# Patient Record
Sex: Female | Born: 1945 | Race: White | Hispanic: No | Smoking: Never smoker
Health system: Southern US, Community
[De-identification: ages and names within clinical notes are randomized; demographics above are authoritative.]

## PROBLEM LIST (undated history)

## (undated) DIAGNOSIS — R011 Cardiac murmur, unspecified: Secondary | ICD-10-CM

## (undated) DIAGNOSIS — D689 Coagulation defect, unspecified: Secondary | ICD-10-CM

## (undated) DIAGNOSIS — T7840XA Allergy, unspecified, initial encounter: Secondary | ICD-10-CM

## (undated) DIAGNOSIS — D6851 Activated protein C resistance: Secondary | ICD-10-CM

## (undated) DIAGNOSIS — H269 Unspecified cataract: Secondary | ICD-10-CM

## (undated) DIAGNOSIS — N200 Calculus of kidney: Secondary | ICD-10-CM

## (undated) DIAGNOSIS — M810 Age-related osteoporosis without current pathological fracture: Secondary | ICD-10-CM

## (undated) DIAGNOSIS — I341 Nonrheumatic mitral (valve) prolapse: Secondary | ICD-10-CM

## (undated) DIAGNOSIS — E785 Hyperlipidemia, unspecified: Secondary | ICD-10-CM

## (undated) DIAGNOSIS — E079 Disorder of thyroid, unspecified: Secondary | ICD-10-CM

## (undated) DIAGNOSIS — F419 Anxiety disorder, unspecified: Secondary | ICD-10-CM

## (undated) DIAGNOSIS — M1990 Unspecified osteoarthritis, unspecified site: Secondary | ICD-10-CM

## (undated) DIAGNOSIS — K219 Gastro-esophageal reflux disease without esophagitis: Secondary | ICD-10-CM

## (undated) HISTORY — DX: Allergy, unspecified, initial encounter: T78.40XA

## (undated) HISTORY — DX: Nonrheumatic mitral (valve) prolapse: I34.1

## (undated) HISTORY — DX: Activated protein C resistance: D68.51

## (undated) HISTORY — DX: Age-related osteoporosis without current pathological fracture: M81.0

## (undated) HISTORY — DX: Gastro-esophageal reflux disease without esophagitis: K21.9

## (undated) HISTORY — DX: Disorder of thyroid, unspecified: E07.9

## (undated) HISTORY — PX: CYSTOCELE REPAIR: SHX163

## (undated) HISTORY — DX: Hyperlipidemia, unspecified: E78.5

## (undated) HISTORY — DX: Calculus of kidney: N20.0

## (undated) HISTORY — DX: Anxiety disorder, unspecified: F41.9

## (undated) HISTORY — PX: COSMETIC SURGERY: SHX468

## (undated) HISTORY — DX: Coagulation defect, unspecified: D68.9

## (undated) HISTORY — DX: Cardiac murmur, unspecified: R01.1

## (undated) HISTORY — PX: BREAST BIOPSY: SHX20

## (undated) HISTORY — DX: Unspecified osteoarthritis, unspecified site: M19.90

## (undated) HISTORY — DX: Unspecified cataract: H26.9

---

## 2016-05-16 HISTORY — PX: COSMETIC SURGERY: SHX468

## 2016-07-15 DIAGNOSIS — M8580 Other specified disorders of bone density and structure, unspecified site: Secondary | ICD-10-CM | POA: Diagnosis not present

## 2016-07-15 DIAGNOSIS — Z1231 Encounter for screening mammogram for malignant neoplasm of breast: Secondary | ICD-10-CM | POA: Diagnosis not present

## 2016-07-15 DIAGNOSIS — Z780 Asymptomatic menopausal state: Secondary | ICD-10-CM | POA: Diagnosis not present

## 2016-08-18 DIAGNOSIS — E039 Hypothyroidism, unspecified: Secondary | ICD-10-CM | POA: Diagnosis not present

## 2016-08-18 DIAGNOSIS — E559 Vitamin D deficiency, unspecified: Secondary | ICD-10-CM | POA: Diagnosis not present

## 2016-08-18 DIAGNOSIS — E785 Hyperlipidemia, unspecified: Secondary | ICD-10-CM | POA: Diagnosis not present

## 2016-08-18 DIAGNOSIS — Z1159 Encounter for screening for other viral diseases: Secondary | ICD-10-CM | POA: Diagnosis not present

## 2016-09-01 DIAGNOSIS — E039 Hypothyroidism, unspecified: Secondary | ICD-10-CM | POA: Diagnosis not present

## 2016-09-01 DIAGNOSIS — L282 Other prurigo: Secondary | ICD-10-CM | POA: Diagnosis not present

## 2016-09-08 DIAGNOSIS — H353131 Nonexudative age-related macular degeneration, bilateral, early dry stage: Secondary | ICD-10-CM | POA: Diagnosis not present

## 2016-09-08 DIAGNOSIS — H02831 Dermatochalasis of right upper eyelid: Secondary | ICD-10-CM | POA: Diagnosis not present

## 2016-09-08 DIAGNOSIS — H02834 Dermatochalasis of left upper eyelid: Secondary | ICD-10-CM | POA: Diagnosis not present

## 2016-10-17 DIAGNOSIS — E039 Hypothyroidism, unspecified: Secondary | ICD-10-CM | POA: Diagnosis not present

## 2016-12-28 DIAGNOSIS — Z86010 Personal history of colonic polyps: Secondary | ICD-10-CM | POA: Diagnosis not present

## 2016-12-28 DIAGNOSIS — K2270 Barrett's esophagus without dysplasia: Secondary | ICD-10-CM | POA: Diagnosis not present

## 2017-01-04 DIAGNOSIS — H02403 Unspecified ptosis of bilateral eyelids: Secondary | ICD-10-CM | POA: Diagnosis not present

## 2017-01-04 DIAGNOSIS — H02834 Dermatochalasis of left upper eyelid: Secondary | ICD-10-CM | POA: Diagnosis not present

## 2017-01-04 DIAGNOSIS — H02831 Dermatochalasis of right upper eyelid: Secondary | ICD-10-CM | POA: Diagnosis not present

## 2017-01-18 DIAGNOSIS — Z86010 Personal history of colonic polyps: Secondary | ICD-10-CM | POA: Diagnosis not present

## 2017-01-31 DIAGNOSIS — H02834 Dermatochalasis of left upper eyelid: Secondary | ICD-10-CM | POA: Diagnosis not present

## 2017-01-31 DIAGNOSIS — H02831 Dermatochalasis of right upper eyelid: Secondary | ICD-10-CM | POA: Diagnosis not present

## 2017-01-31 DIAGNOSIS — H02403 Unspecified ptosis of bilateral eyelids: Secondary | ICD-10-CM | POA: Diagnosis not present

## 2017-02-06 DIAGNOSIS — Z01812 Encounter for preprocedural laboratory examination: Secondary | ICD-10-CM | POA: Diagnosis not present

## 2017-02-07 DIAGNOSIS — H02402 Unspecified ptosis of left eyelid: Secondary | ICD-10-CM | POA: Diagnosis not present

## 2017-02-07 DIAGNOSIS — H02401 Unspecified ptosis of right eyelid: Secondary | ICD-10-CM | POA: Diagnosis not present

## 2017-02-07 DIAGNOSIS — H02834 Dermatochalasis of left upper eyelid: Secondary | ICD-10-CM | POA: Diagnosis not present

## 2017-02-07 DIAGNOSIS — H02831 Dermatochalasis of right upper eyelid: Secondary | ICD-10-CM | POA: Diagnosis not present

## 2017-02-07 DIAGNOSIS — H02403 Unspecified ptosis of bilateral eyelids: Secondary | ICD-10-CM | POA: Diagnosis not present

## 2017-04-11 DIAGNOSIS — N812 Incomplete uterovaginal prolapse: Secondary | ICD-10-CM | POA: Diagnosis not present

## 2017-04-11 DIAGNOSIS — Z124 Encounter for screening for malignant neoplasm of cervix: Secondary | ICD-10-CM | POA: Diagnosis not present

## 2017-05-03 DIAGNOSIS — Z23 Encounter for immunization: Secondary | ICD-10-CM | POA: Diagnosis not present

## 2017-06-27 DIAGNOSIS — H57813 Brow ptosis, bilateral: Secondary | ICD-10-CM | POA: Diagnosis not present

## 2017-07-06 DIAGNOSIS — Z6825 Body mass index (BMI) 25.0-25.9, adult: Secondary | ICD-10-CM | POA: Diagnosis not present

## 2017-07-06 DIAGNOSIS — F329 Major depressive disorder, single episode, unspecified: Secondary | ICD-10-CM | POA: Diagnosis not present

## 2017-07-06 DIAGNOSIS — K219 Gastro-esophageal reflux disease without esophagitis: Secondary | ICD-10-CM | POA: Diagnosis not present

## 2017-07-06 DIAGNOSIS — Z1331 Encounter for screening for depression: Secondary | ICD-10-CM | POA: Diagnosis not present

## 2017-07-06 DIAGNOSIS — E039 Hypothyroidism, unspecified: Secondary | ICD-10-CM | POA: Diagnosis not present

## 2017-07-06 DIAGNOSIS — Z1231 Encounter for screening mammogram for malignant neoplasm of breast: Secondary | ICD-10-CM | POA: Diagnosis not present

## 2017-07-06 DIAGNOSIS — N8110 Cystocele, unspecified: Secondary | ICD-10-CM | POA: Diagnosis not present

## 2017-07-06 DIAGNOSIS — Z136 Encounter for screening for cardiovascular disorders: Secondary | ICD-10-CM | POA: Diagnosis not present

## 2017-07-18 DIAGNOSIS — N812 Incomplete uterovaginal prolapse: Secondary | ICD-10-CM | POA: Diagnosis not present

## 2017-07-25 DIAGNOSIS — D251 Intramural leiomyoma of uterus: Secondary | ICD-10-CM | POA: Diagnosis not present

## 2017-07-25 DIAGNOSIS — N812 Incomplete uterovaginal prolapse: Secondary | ICD-10-CM | POA: Diagnosis not present

## 2017-07-25 DIAGNOSIS — N8111 Cystocele, midline: Secondary | ICD-10-CM | POA: Diagnosis not present

## 2017-07-25 DIAGNOSIS — N880 Leukoplakia of cervix uteri: Secondary | ICD-10-CM | POA: Diagnosis not present

## 2017-07-25 DIAGNOSIS — D259 Leiomyoma of uterus, unspecified: Secondary | ICD-10-CM | POA: Diagnosis not present

## 2017-07-25 DIAGNOSIS — N814 Uterovaginal prolapse, unspecified: Secondary | ICD-10-CM | POA: Diagnosis not present

## 2017-07-25 DIAGNOSIS — N858 Other specified noninflammatory disorders of uterus: Secondary | ICD-10-CM | POA: Diagnosis not present

## 2017-07-25 HISTORY — PX: ABDOMINAL HYSTERECTOMY: SHX81

## 2017-07-26 DIAGNOSIS — D251 Intramural leiomyoma of uterus: Secondary | ICD-10-CM | POA: Diagnosis not present

## 2017-07-26 DIAGNOSIS — N880 Leukoplakia of cervix uteri: Secondary | ICD-10-CM | POA: Diagnosis not present

## 2017-07-26 DIAGNOSIS — N812 Incomplete uterovaginal prolapse: Secondary | ICD-10-CM | POA: Diagnosis not present

## 2017-07-27 DIAGNOSIS — K625 Hemorrhage of anus and rectum: Secondary | ICD-10-CM | POA: Diagnosis not present

## 2017-07-27 DIAGNOSIS — K529 Noninfective gastroenteritis and colitis, unspecified: Secondary | ICD-10-CM | POA: Diagnosis not present

## 2017-07-27 DIAGNOSIS — Z90710 Acquired absence of both cervix and uterus: Secondary | ICD-10-CM | POA: Diagnosis not present

## 2017-08-02 DIAGNOSIS — K625 Hemorrhage of anus and rectum: Secondary | ICD-10-CM | POA: Diagnosis not present

## 2017-08-02 DIAGNOSIS — K2270 Barrett's esophagus without dysplasia: Secondary | ICD-10-CM | POA: Diagnosis not present

## 2017-08-02 DIAGNOSIS — R1030 Lower abdominal pain, unspecified: Secondary | ICD-10-CM | POA: Diagnosis not present

## 2017-08-03 DIAGNOSIS — R8279 Other abnormal findings on microbiological examination of urine: Secondary | ICD-10-CM | POA: Diagnosis not present

## 2017-08-03 DIAGNOSIS — E039 Hypothyroidism, unspecified: Secondary | ICD-10-CM | POA: Diagnosis not present

## 2017-08-03 DIAGNOSIS — F329 Major depressive disorder, single episode, unspecified: Secondary | ICD-10-CM | POA: Diagnosis not present

## 2017-08-03 DIAGNOSIS — Z6825 Body mass index (BMI) 25.0-25.9, adult: Secondary | ICD-10-CM | POA: Diagnosis not present

## 2017-08-03 DIAGNOSIS — N390 Urinary tract infection, site not specified: Secondary | ICD-10-CM | POA: Diagnosis not present

## 2017-08-03 DIAGNOSIS — K559 Vascular disorder of intestine, unspecified: Secondary | ICD-10-CM | POA: Diagnosis not present

## 2017-08-15 DIAGNOSIS — H57813 Brow ptosis, bilateral: Secondary | ICD-10-CM | POA: Diagnosis not present

## 2017-09-07 DIAGNOSIS — H25813 Combined forms of age-related cataract, bilateral: Secondary | ICD-10-CM | POA: Diagnosis not present

## 2017-09-07 DIAGNOSIS — H353132 Nonexudative age-related macular degeneration, bilateral, intermediate dry stage: Secondary | ICD-10-CM | POA: Diagnosis not present

## 2017-09-15 DIAGNOSIS — E039 Hypothyroidism, unspecified: Secondary | ICD-10-CM | POA: Diagnosis not present

## 2017-09-15 DIAGNOSIS — Z136 Encounter for screening for cardiovascular disorders: Secondary | ICD-10-CM | POA: Diagnosis not present

## 2017-09-21 DIAGNOSIS — Z1231 Encounter for screening mammogram for malignant neoplasm of breast: Secondary | ICD-10-CM | POA: Diagnosis not present

## 2018-01-17 ENCOUNTER — Encounter: Payer: Self-pay | Admitting: Family Medicine

## 2018-01-17 ENCOUNTER — Ambulatory Visit (INDEPENDENT_AMBULATORY_CARE_PROVIDER_SITE_OTHER): Payer: Medicare Other | Admitting: Family Medicine

## 2018-01-17 VITALS — BP 122/69 | HR 49 | Temp 97.4°F | Ht 68.0 in | Wt 151.6 lb

## 2018-01-17 DIAGNOSIS — E782 Mixed hyperlipidemia: Secondary | ICD-10-CM

## 2018-01-17 DIAGNOSIS — F411 Generalized anxiety disorder: Secondary | ICD-10-CM | POA: Diagnosis not present

## 2018-01-17 DIAGNOSIS — K219 Gastro-esophageal reflux disease without esophagitis: Secondary | ICD-10-CM | POA: Insufficient documentation

## 2018-01-17 DIAGNOSIS — E039 Hypothyroidism, unspecified: Secondary | ICD-10-CM | POA: Diagnosis not present

## 2018-01-17 DIAGNOSIS — J309 Allergic rhinitis, unspecified: Secondary | ICD-10-CM | POA: Diagnosis not present

## 2018-01-17 DIAGNOSIS — F339 Major depressive disorder, recurrent, unspecified: Secondary | ICD-10-CM | POA: Diagnosis not present

## 2018-01-17 DIAGNOSIS — R32 Unspecified urinary incontinence: Secondary | ICD-10-CM | POA: Diagnosis not present

## 2018-01-17 DIAGNOSIS — E785 Hyperlipidemia, unspecified: Secondary | ICD-10-CM | POA: Insufficient documentation

## 2018-01-17 MED ORDER — ATORVASTATIN CALCIUM 40 MG PO TABS: 40.0000 mg | ORAL_TABLET | Freq: Every day | ORAL | 1 refills | Status: DC

## 2018-01-17 MED ORDER — MONTELUKAST SODIUM 10 MG PO TABS: 10.0000 mg | ORAL_TABLET | Freq: Every day | ORAL | 1 refills | Status: DC

## 2018-01-17 MED ORDER — LEVOTHYROXINE SODIUM 88 MCG PO TABS: 88.0000 ug | ORAL_TABLET | Freq: Every day | ORAL | 0 refills | Status: DC

## 2018-01-17 MED ORDER — FLUOXETINE HCL 20 MG PO CAPS: 20.0000 mg | ORAL_CAPSULE | Freq: Every day | ORAL | 1 refills | Status: DC

## 2018-01-17 MED ORDER — MIRABEGRON ER 25 MG PO TB24: 25.0000 mg | ORAL_TABLET | Freq: Every day | ORAL | 1 refills | Status: DC

## 2018-01-17 NOTE — Progress Notes (Signed)
BP 122/69   Pulse (!) 49   Temp (!) 97.4 F (36.3 C) (Oral)   Ht 5' 8"  (1.727 m)   Wt 151 lb 9.6 oz (68.8 kg)   BMI 23.05 kg/m    Subjective:    Patient ID: Teresa Frederick, female    DOB: April 29, 1946, 72 y.o.   MRN: [id number]  HPI: Teresa Frederick is a 72 y.o. female presenting on 01/17/2018 for New Patient (Initial Visit) (Pateint moved from [place]. Patient states she had a hysterectomy 07/25/17. Prior to that had bladder leakage. Waited until 5/31 to have inntercourse and states that she started bleeding and now is having bladder leaking again.) and Establish Care   HPI Hypothyroidism recheck Patient is coming in for thyroid recheck today as well. They deny any issues with hair changes or heat or cold problems or diarrhea or constipation. They deny any chest pain or palpitations. They are currently on levothyroxine 39mcrograms   Hyperlipidemia Patient is coming in for recheck of his hyperlipidemia. The patient is currently taking atorvastatin 40. They deny any issues with myalgias or history of liver damage from it. They deny any focal numbness or weakness or chest pain.   GERD Patient is currently on Nexium.  She denies any major symptoms or abdominal pain or belching or burping. She denies any blood in her stool or lightheadedness or dizziness.   Anxiety and depression Patient has been on treatment for anxiety depression as well from her previous doctor [place]  She has been on Prozac and says that it works well for her and has been very happy with it for quite a few years.  She denies any suicidal ideations or thoughts of hurting herself. Depression screen PHQ 2/9 01/17/2018  Decreased Interest 0  Down, Depressed, Hopeless 0  PHQ - 2 Score 0    Chronic allergic rhinitis Takes medication for chronic nasal and sinus allergy congestion.  She has been on montelukast and it has worked well for her.  She wishes to continue it.  Urinary  incontinence Patient has some bladder leakage at times and says that she used to be on treatment for it but has not been in some time because of expense.  She says that she has trouble with urgency and trouble making it to the restroom on time.  She denies any fevers or chills or pain with urination.  She denies any blood in urine.  Relevant past medical, surgical, family and social history reviewed and updated as indicated. Interim medical history since our last visit reviewed. Allergies and medications reviewed and updated.  Review of Systems  Constitutional: Negative for chills and fever.  HENT: Positive for congestion. Negative for ear discharge and ear pain.   Eyes: Negative for redness and visual disturbance.  Respiratory: Positive for cough. Negative for chest tightness and shortness of breath.   Cardiovascular: Negative for chest pain and leg swelling.  Gastrointestinal: Negative for abdominal pain.  Genitourinary: Positive for frequency and urgency. Negative for difficulty urinating, dysuria, hematuria, vaginal bleeding, vaginal discharge and vaginal pain.  Musculoskeletal: Negative for back pain and gait problem.  Skin: Negative for rash.  Neurological: Negative for light-headedness and headaches.  Psychiatric/Behavioral: Positive for dysphoric mood. Negative for agitation, behavioral problems, self-injury, sleep disturbance and suicidal ideas. The patient is nervous/anxious.   All other systems reviewed and are negative.   Per HPI unless specifically indicated above  Social History   Socioeconomic History  . Marital status:  Widowed    Spouse name: Not on file  . Number of children: Not on file  . Years of education: Not on file  . Highest education level: Not on file  Occupational History  . Not on file  Social Needs  . Financial resource strain: Not on file  . Food insecurity:    Worry: Not on file    Inability: Not on file  . Transportation needs:    Medical: Not  on file    Non-medical: Not on file  Tobacco Use  . Smoking status: Never Smoker  . Smokeless tobacco: Never Used  Substance and Sexual Activity  . Alcohol use: Yes    Alcohol/week: 1.0 standard drinks    Types: 1 Shots of liquor per week    Comment: frequently  . Drug use: Never  . Sexual activity: Yes  Lifestyle  . Physical activity:    Days per week: Not on file    Minutes per session: Not on file  . Stress: Not on file  Relationships  . Social connections:    Talks on phone: Not on file    Gets together: Not on file    Attends religious service: Not on file    Active member of club or organization: Not on file    Attends meetings of clubs or organizations: Not on file    Relationship status: Not on file  . Intimate partner violence:    Fear of current or ex partner: Not on file    Emotionally abused: Not on file    Physically abused: Not on file    Forced sexual activity: Not on file  Other Topics Concern  . Not on file  Social History Narrative   Been with current boyfriend since 2016    Past Surgical History:  Procedure Laterality Date  . ABDOMINAL HYSTERECTOMY  07/25/2017  . COSMETIC SURGERY      Family History  Problem Relation Age of Onset  . COPD Mother   . Diabetes Mother   . Hearing loss Mother   . Hypertension Mother   . Stroke Mother   . Early death Father   . Heart disease Father     Allergies as of 01/17/2018      Reactions   Phenergan [promethazine Hcl] Other (See Comments)   tongue swelling       Medication List        Accurate as of 01/17/18 10:19 AM. Always use your most recent med list.          atorvastatin 40 MG tablet Commonly known as:  LIPITOR Take 1 tablet by mouth daily at 2 PM.   BAYER ASPIRIN 325 MG tablet Generic drug:  aspirin Take 325 mg by mouth daily.   docusate sodium 100 MG capsule Commonly known as:  COLACE Take 100 mg by mouth daily.   esomeprazole 20 MG capsule Commonly known as:  NEXIUM Take 20 mg by  mouth daily at 12 noon.   fenoprofen 600 MG Tabs tablet Commonly known as:  NALFON Take 600 mg by mouth.   FLUoxetine 20 MG capsule Commonly known as:  PROZAC Take 20 mg by mouth daily.   levothyroxine 88 MCG tablet Commonly known as:  SYNTHROID, LEVOTHROID Take 88 mcg by mouth daily before breakfast.   MIRALAX PO Take by mouth.   montelukast 10 MG tablet Commonly known as:  SINGULAIR Take 10 mg by mouth at bedtime.   OVER THE COUNTER MEDICATION Preser Vision  Objective:    BP 122/69   Pulse (!) 49   Temp (!) 97.4 F (36.3 C) (Oral)   Ht 5' 8"  (1.727 m)   Wt 151 lb 9.6 oz (68.8 kg)   BMI 23.05 kg/m   Wt Readings from Last 3 Encounters:  01/17/18 151 lb 9.6 oz (68.8 kg)    Physical Exam  Constitutional: She is oriented to person, place, and time. She appears well-developed and well-nourished. No distress.  HENT:  Mouth/Throat: No oropharyngeal exudate.  Eyes: Pupils are equal, round, and reactive to light. Conjunctivae and EOM are normal.  Neck: Neck supple. No thyromegaly present.  Cardiovascular: Normal rate, regular rhythm, normal heart sounds and intact distal pulses.  No murmur heard. Pulmonary/Chest: Effort normal and breath sounds normal. No respiratory distress. She has no wheezes.  Abdominal: Soft. Bowel sounds are normal. She exhibits no distension and no mass. There is no tenderness. There is no guarding.  Musculoskeletal: Normal range of motion. She exhibits no edema.  Lymphadenopathy:    She has no cervical adenopathy.  Neurological: She is alert and oriented to person, place, and time. Coordination normal.  Skin: Skin is warm and dry. No rash noted. She is not diaphoretic.  Psychiatric: She has a normal mood and affect. Her behavior is normal. Her mood appears not anxious. She does not exhibit a depressed mood. She expresses no suicidal ideation. She expresses no suicidal plans.  Nursing note and vitals reviewed.   No results found for  this or any previous visit.    Assessment & Plan:   Problem List Items Addressed This Visit      Digestive   GERD (gastroesophageal reflux disease)   Relevant Medications   docusate sodium (COLACE) 100 MG capsule   esomeprazole (NEXIUM) 20 MG capsule   Polyethylene Glycol 3350 (MIRALAX PO)   Other Relevant Orders   CBC with Differential/Platelet (Completed)   CMP14+EGFR (Completed)     Endocrine   Hypothyroidism   Relevant Medications   levothyroxine (SYNTHROID, LEVOTHROID) 88 MCG tablet   Other Relevant Orders   TSH (Completed)     Other   Hyperlipemia   Relevant Medications   aspirin (BAYER ASPIRIN) 325 MG tablet   atorvastatin (LIPITOR) 40 MG tablet   Other Relevant Orders   Lipid panel (Completed)   Depression, recurrent (HCC) - Primary   Relevant Medications   FLUoxetine (PROZAC) 20 MG capsule   GAD (generalized anxiety disorder)   Relevant Medications   FLUoxetine (PROZAC) 20 MG capsule    Other Visit Diagnoses    Chronic allergic rhinitis       Relevant Medications   montelukast (SINGULAIR) 10 MG tablet   Urinary incontinence, unspecified type       Relevant Medications   mirabegron ER (MYRBETRIQ) 25 MG TB24 tablet   Other Relevant Orders   Ambulatory referral to Gynecology       Follow up plan: Return in about 6 months (around 07/18/2018), or if symptoms worsen or fail to improve, for hypothyroid, GERD, GAD.  Caryl Pina, MD [facility] 01/17/2018, 10:19 AM

## 2018-01-18 LAB — CBC WITH DIFFERENTIAL/PLATELET
Basophils Absolute: 0 10*3/uL (ref 0.0–0.2)
Basos: 0 %
EOS (ABSOLUTE): 0.1 10*3/uL (ref 0.0–0.4)
Eos: 2 %
Hematocrit: 38.7 % (ref 34.0–46.6)
Hemoglobin: 12.6 g/dL (ref 11.1–15.9)
IMMATURE GRANULOCYTES: 0 %
Immature Grans (Abs): 0 10*3/uL (ref 0.0–0.1)
Lymphocytes Absolute: 1.6 10*3/uL (ref 0.7–3.1)
Lymphs: 30 %
MCH: 30.7 pg (ref 26.6–33.0)
MCHC: 32.6 g/dL (ref 31.5–35.7)
MCV: 94 fL (ref 79–97)
MONOS ABS: 0.6 10*3/uL (ref 0.1–0.9)
Monocytes: 10 %
NEUTROS PCT: 58 %
Neutrophils Absolute: 3.2 10*3/uL (ref 1.4–7.0)
Platelets: 304 10*3/uL (ref 150–450)
RBC: 4.11 x10E6/uL (ref 3.77–5.28)
RDW: 13.7 % (ref 12.3–15.4)
WBC: 5.5 10*3/uL (ref 3.4–10.8)

## 2018-01-18 LAB — LIPID PANEL
CHOL/HDL RATIO: 2.7 ratio (ref 0.0–4.4)
Cholesterol, Total: 179 mg/dL (ref 100–199)
HDL: 67 mg/dL (ref >39)
LDL Calculated: 99 mg/dL (ref 0–99)
Triglycerides: 66 mg/dL (ref 0–149)
VLDL Cholesterol Cal: 13 mg/dL (ref 5–40)

## 2018-01-18 LAB — TSH: TSH: 0.741 u[IU]/mL (ref 0.450–4.500)

## 2018-01-18 LAB — CMP14+EGFR
A/G RATIO: 2.3 — AB (ref 1.2–2.2)
ALK PHOS: 60 IU/L (ref 39–117)
ALT: 18 IU/L (ref 0–32)
AST: 21 IU/L (ref 0–40)
Albumin: 4.5 g/dL (ref 3.5–4.8)
BUN/Creatinine Ratio: 20 (ref 12–28)
BUN: 17 mg/dL (ref 8–27)
Bilirubin Total: 0.6 mg/dL (ref 0.0–1.2)
CALCIUM: 9.4 mg/dL (ref 8.7–10.3)
CO2: 24 mmol/L (ref 20–29)
CREATININE: 0.83 mg/dL (ref 0.57–1.00)
Chloride: 106 mmol/L (ref 96–106)
GFR calc Af Amer: 83 mL/min/{1.73_m2} (ref >59)
GFR, EST NON AFRICAN AMERICAN: 72 mL/min/{1.73_m2} (ref >59)
Globulin, Total: 2 g/dL (ref 1.5–4.5)
Glucose: 89 mg/dL (ref 65–99)
POTASSIUM: 4.1 mmol/L (ref 3.5–5.2)
Sodium: 145 mmol/L — ABNORMAL HIGH (ref 134–144)
Total Protein: 6.5 g/dL (ref 6.0–8.5)

## 2018-02-22 ENCOUNTER — Ambulatory Visit (INDEPENDENT_AMBULATORY_CARE_PROVIDER_SITE_OTHER): Payer: Medicare Other | Admitting: *Deleted

## 2018-02-22 ENCOUNTER — Encounter: Payer: Self-pay | Admitting: *Deleted

## 2018-02-22 VITALS — BP 105/60 | HR 59 | Ht 64.0 in | Wt 153.0 lb

## 2018-02-22 DIAGNOSIS — Z23 Encounter for immunization: Secondary | ICD-10-CM

## 2018-02-22 DIAGNOSIS — Z0000 Encounter for general adult medical examination without abnormal findings: Secondary | ICD-10-CM

## 2018-02-22 DIAGNOSIS — K219 Gastro-esophageal reflux disease without esophagitis: Secondary | ICD-10-CM

## 2018-02-22 NOTE — Progress Notes (Signed)
Subjective:   Teresa Frederick is a 72 y.o. female who presents for a Medicare Annual Wellness Visit. Teresa Frederick lives at home with her boyfriend. She moved from [place] recently after living there for about 10 years. She and her husband lived in [place] prior to that and moved to [place] when he became sick with multiple myeloma and pulmonary fibrosis. Her nephew lives in [place]. She is close with him and his children. She also has a son and a daughter but they live in [place]. She is able to talk with them often and visits as often as possible.    Review of Systems    Patient reports that her overall health is unchanged compared to last year.  Cardiac Risk Factors include: advanced age (>45mn, >>79women);sedentary lifestyle  GI: history of Barrett's esophagus. [facility] [place]. Has increased GERD recently. States that she eats a lot of tomatoes and doesn't take her omeprazole regularly, only as needed.  Urinary: incontinence. Hysterectomy and bladder tack. Worked well at first but has been having problems recently with incontinence both stress and urge and also inability to completely void. Dr Dettinger referred to urogynecologist.   All other systems negative       Current Medications (verified) Outpatient Encounter Medications as of 02/22/2018  Medication Sig  . aspirin (BAYER ASPIRIN) 325 MG tablet Take 325 mg by mouth daily.  .Marland Kitchenatorvastatin (LIPITOR) 40 MG tablet Take 1 tablet (40 mg total) by mouth daily at 2 PM.  . docusate sodium (COLACE) 100 MG capsule Take 100 mg by mouth daily.  .Marland Kitchenesomeprazole (NEXIUM) 20 MG capsule Take 20 mg by mouth daily at 12 noon.  . fenoprofen (NALFON) 600 MG TABS tablet Take 600 mg by mouth.  .Marland KitchenFLUoxetine (PROZAC) 20 MG capsule Take 1 capsule (20 mg total) by mouth daily.  .Marland Kitchenlevothyroxine (SYNTHROID, LEVOTHROID) 88 MCG tablet Take 1  tablet (88 mcg total) by mouth daily before breakfast.  . mirabegron ER (MYRBETRIQ) 25 MG TB24 tablet Take 1 tablet (25 mg total) by mouth daily.  . montelukast (SINGULAIR) 10 MG tablet Take 1 tablet (10 mg total) by mouth at bedtime.  .Marland KitchenOVER THE COUNTER MEDICATION Preser Vision  . Polyethylene Glycol 3350 (MIRALAX PO) Take by mouth.   No facility-administered encounter medications on file as of 02/22/2018.     Allergies (verified) Nsaids and Phenergan [promethazine hcl]   History: Past Medical History:  Diagnosis Date  . Allergy   . Anxiety   . Arthritis   . Cataract   . Clotting disorder ([place]   . GERD (gastroesophageal reflux disease)   . Heart murmur   . Hyperlipidemia   . Mitral valve prolapse   . Osteoporosis   . Thyroid disease    Past Surgical History:  Procedure Laterality Date  . ABDOMINAL HYSTERECTOMY  07/25/2017  . COSMETIC SURGERY     Family History  Problem Relation Age of Onset  . COPD Mother   . Diabetes Mother   . Hearing loss Mother   . Hypertension Mother   . Stroke Mother 826 . Early death Father   . Heart disease Father   . Heart attack Father 526 . Obesity Sister   . Aneurysm Sister   . COPD Brother   . Cancer Grandchild        metastatic cancer from melanoma in her eye   Social History   Socioeconomic  History  . Marital status: Widowed    Spouse name: Not on file  . Number of children: Not on file  . Years of education: Not on file  . Highest education level: Not on file  Occupational History    Comment: real estate investment  Social Needs  . Financial resource strain: Not hard at all  . Food insecurity:    Worry: Never true    Inability: Never true  . Transportation needs:    Medical: No    Non-medical: No  Tobacco Use  . Smoking status: Never Smoker  . Smokeless tobacco: Never Used  Substance and Sexual Activity  . Alcohol use: Yes    Alcohol/week: 1.0 standard drinks    Types: 1 Shots of liquor per week    Comment:  frequently  . Drug use: Never  . Sexual activity: Yes  Lifestyle  . Physical activity:    Days per week: 0 days    Minutes per session: 0 min  . Stress: Not at all  Relationships  . Social connections:    Talks on phone: More than three times a week    Gets together: More than three times a week    Attends religious service: Not on file    Active member of club or organization: Not on file    Attends meetings of clubs or organizations: Not on file    Relationship status: Not on file  Other Topics Concern  . Not on file  Social History Narrative   Been with current boyfriend since 2016    Tobacco Use No.  Clinical Intake:     Pain : No/denies pain     Nutritional Status: BMI 25 -29 Overweight Diabetes: No  How often do you need to have someone help you when you read instructions, pamphlets, or other written materials from your doctor or pharmacy?: 1 - Never What is the last grade level you completed in school?: high school     Information entered by :: Chong Sicilian, RN   Activities of Daily Living In your present state of health, do you have any difficulty performing the following activities: 02/22/2018  Hearing? Y  Comment wears hearing aids  Vision? N  Difficulty concentrating or making decisions? N  Walking or climbing stairs? N  Dressing or bathing? N  Doing errands, shopping? N  Preparing Food and eating ? N  Using the Toilet? N  In the past six months, have you accidently leaked urine? N  Do you have problems with loss of bowel control? N  Managing your Medications? N  Managing your Finances? N  Housekeeping or managing your Housekeeping? N  Some recent data might be hidden     Diet 2 meals a day. Cooks most meals at home Drinks water Has a few scotches a week  Exercise Type of exercise: walking, Time (Minutes): 30, Frequency (Times/Week): 3, Weekly Exercise (Minutes/Week): 90, Intensity: Mild, Exercise limited by: None  identified   Depression Screen PHQ 2/9 Scores 01/17/2018  PHQ - 2 Score 0     Fall Risk Fall Risk  01/17/2018  Falls in the past year? No    Safety Is the patient's home free of loose throw rugs in walkways, pet beds, electrical cords, etc?   yes      Handrails on the stairs?   yes      Adequate lighting?   yes  Patient Care Team: Dettinger, Fransisca Kaufmann, MD as PCP - General (Family Medicine)  Hospitalizations,  surgeries, and ER visits in previous 12 months No hospitalizations, ER visits, or surgeries this past year.   Objective:    Today's Vitals   02/22/18 1114  BP: 105/60  Pulse: (!) 59  Weight: 153 lb (69.4 kg)  Height: _0  (1.626 m)   Body mass index is 26.26 kg/m.  No flowsheet data found.  Hearing/Vision  No hearing or vision deficits noted during visit.  Cognitive Function: MMSE - Mini Mental State Exam 02/22/2018  Orientation to time 5  Orientation to Place 5  Registration 3  Attention/ Calculation 5  Recall 3  Language- name 2 objects 2  Language- repeat 1  Language- follow 3 step command 3  Language- read & follow direction 1  Write a sentence 1  Copy design 1  Total score 30       Normal Cognitive Function Screening: Yes    Immunizations and Health Maintenance Immunization History  Administered Date(s) Administered  . Influenza, High Dose Seasonal PF 02/22/2018   Health Maintenance Due  Topic Date Due  . Hepatitis C Screening  11/27/1945   Health Maintenance  Topic Date Due  . Hepatitis C Screening  [birth year]  . INFLUENZA VACCINE  09/06/2018 (Originally 12/14/2017)  . PNA vac Low Risk Adult (1 of 2 - PCV13) 01/18/2019 (Originally 05/08/2012)  . TETANUS/TDAP  02/23/2019 (Originally 05/08/1966)  . MAMMOGRAM  02/14/2019  . COLONOSCOPY  01/18/2022  . DEXA SCAN  Completed        Assessment:   This is a routine wellness examination for Teresa Frederick.    Plan:    Goals    . Exercise 150 min/wk Moderate Activity         Health  Maintenance Recommendations: Influenza vaccine   Additional Screening Recommendations: Lung: Low Dose CT Chest recommended if Age 77-80 years, 30 pack-year currently smoking OR have quit w/in 15years. Patient does not qualify. Hepatitis C Screening recommended: yes  Today's Orders Orders Placed This Encounter  Procedures  . Flu vaccine HIGH DOSE PF  . Ambulatory referral to Gastroenterology    Referral Priority:   Routine    Referral Type:   Consultation    Referral Reason:   Specialty Services Required    Requested Specialty:   Gastroenterology    Number of Visits Requested:   1  Due for endoscopy to f/u on Barrett's Esophagus  Keep f/u with Dettinger, Fransisca Kaufmann, MD and any other specialty appointments you may have Continue current medications Move carefully to avoid falls. Use assistive devices like a cane or walker if needed. Aim for at least 150 minutes of moderate activity a week. This can be done with chair exercises if necessary. Read or work on puzzles daily Stay connected with friends and family  I have personally reviewed and noted the following in the patient's chart:   . Medical and social history . Use of alcohol, tobacco or illicit drugs  . Current medications and supplements . Functional ability and status . Nutritional status . Physical activity . Advanced directives . List of other physicians . Hospitalizations, surgeries, and ER visits in previous 12 months . Vitals . Screenings to include cognitive, depression, and falls . Referrals and appointments  In addition, I have reviewed and discussed with patient certain preventive protocols, quality metrics, and best practice recommendations. A written personalized care plan for preventive services as well as general preventive health recommendations were provided to patient.     Chong Sicilian, RN   02/22/2018

## 2018-02-22 NOTE — Patient Instructions (Signed)
  Ms. Friedt , Thank you for taking time to come for your Medicare Wellness Visit. I appreciate your ongoing commitment to your health goals. Please review the following plan we discussed and let me know if I can assist you in the future.   These are the goals we discussed: Goals    . Exercise 150 min/wk Moderate Activity       This is a list of the screening recommended for you and due dates:  Health Maintenance  Topic Date Due  .  Hepatitis C: One time screening is recommended by Center for Disease Control  (CDC) for  adults born from [birth year] through 1964.   [birth year]  . Tetanus Vaccine  05/08/1966  . Flu Shot  09/06/2018*  . Pneumonia vaccines (1 of 2 - PCV13) 01/18/2019*  . Mammogram  02/14/2019  . Colon Cancer Screening  01/18/2022  . DEXA scan (bone density measurement)  Completed  *Topic was postponed. The date shown is not the original due date.

## 2018-03-05 DIAGNOSIS — K219 Gastro-esophageal reflux disease without esophagitis: Secondary | ICD-10-CM | POA: Diagnosis not present

## 2018-03-05 DIAGNOSIS — K2270 Barrett's esophagus without dysplasia: Secondary | ICD-10-CM | POA: Diagnosis not present

## 2018-03-07 DIAGNOSIS — N398 Other specified disorders of urinary system: Secondary | ICD-10-CM | POA: Diagnosis not present

## 2018-03-07 DIAGNOSIS — N3946 Mixed incontinence: Secondary | ICD-10-CM | POA: Diagnosis not present

## 2018-03-07 DIAGNOSIS — N952 Postmenopausal atrophic vaginitis: Secondary | ICD-10-CM | POA: Diagnosis not present

## 2018-03-07 DIAGNOSIS — K599 Functional intestinal disorder, unspecified: Secondary | ICD-10-CM | POA: Diagnosis not present

## 2018-03-30 DIAGNOSIS — K228 Other specified diseases of esophagus: Secondary | ICD-10-CM | POA: Diagnosis not present

## 2018-03-30 DIAGNOSIS — K2270 Barrett's esophagus without dysplasia: Secondary | ICD-10-CM | POA: Diagnosis not present

## 2018-03-30 DIAGNOSIS — K317 Polyp of stomach and duodenum: Secondary | ICD-10-CM | POA: Diagnosis not present

## 2018-04-06 ENCOUNTER — Encounter: Payer: Self-pay | Admitting: Family Medicine

## 2018-04-06 ENCOUNTER — Ambulatory Visit (INDEPENDENT_AMBULATORY_CARE_PROVIDER_SITE_OTHER): Payer: Medicare Other | Admitting: Family Medicine

## 2018-04-06 VITALS — BP 128/87 | HR 74 | Temp 96.6°F | Ht 64.0 in | Wt 153.0 lb

## 2018-04-06 DIAGNOSIS — R300 Dysuria: Secondary | ICD-10-CM | POA: Diagnosis not present

## 2018-04-06 DIAGNOSIS — R350 Frequency of micturition: Secondary | ICD-10-CM | POA: Diagnosis not present

## 2018-04-06 LAB — MICROSCOPIC EXAMINATION
RENAL EPITHEL UA: NONE SEEN /HPF
WBC, UA: >30 /hpf — AB (ref 0–5)

## 2018-04-06 LAB — URINALYSIS, COMPLETE
Bilirubin, UA: NEGATIVE
Glucose, UA: NEGATIVE
Ketones, UA: NEGATIVE
Nitrite, UA: NEGATIVE
PH UA: 6 (ref 5.0–7.5)
Protein, UA: NEGATIVE
RBC, UA: NEGATIVE
SPEC GRAV UA: 1.02 (ref 1.005–1.030)
Urobilinogen, Ur: 0.2 mg/dL (ref 0.2–1.0)

## 2018-04-06 NOTE — Progress Notes (Signed)
Subjective:    Patient ID: Teresa Frederick, female    DOB: 11/13/1945, 72 y.o.   MRN: [id number]  Chief Complaint:  Urinary Tract Infection   HPI: Teresa Frederick is a 72 y.o. female presenting on 04/06/2018 for Urinary Tract Infection  Pt presents today with complaints of lower abdominal pressure with voiding, urinary frequency, and urinary urgency. She states she does not have dysuria, just a pressure with voiding. Pt states she had a hysterectomy in March 2018, states they tacked her bladder after the hysterectomy. States she has had frequency and urgency for a while. States she was started on Myrbetriq for her symptoms. She states after starting this medication she developed the lower abdominal pressure with voiding. States she stopped the medication due to the symptoms. She reports the pressure has decreased but is still present at times. She denies fever, chills, fatigue, confusion, flank pain, or hematuria.   Pt was seeing Dr. Zigmund Daniel and is unsatisfied with the care. States she will be switching her GYN. She states she will call [facility] for an appointment.   Relevant past medical, surgical, family, and social history reviewed and updated as indicated.  Allergies and medications reviewed and updated.   Past Medical History:  Diagnosis Date  . Allergy   . Anxiety   . Arthritis   . Cataract   . Clotting disorder ([place])   . GERD (gastroesophageal reflux disease)   . Heart murmur   . Hyperlipidemia   . Mitral valve prolapse   . Osteoporosis   . Thyroid disease     Past Surgical History:  Procedure Laterality Date  . ABDOMINAL HYSTERECTOMY  07/25/2017  . COSMETIC SURGERY      Social History   Socioeconomic History  . Marital status: Widowed    Spouse name: Not on file  . Number of children: Not on file  . Years of education: Not on file  . Highest education level: Not on file  Occupational History    Comment: real estate investment  Social Needs  .  Financial resource strain: Not hard at all  . Food insecurity:    Worry: Never true    Inability: Never true  . Transportation needs:    Medical: No    Non-medical: No  Tobacco Use  . Smoking status: Never Smoker  . Smokeless tobacco: Never Used  Substance and Sexual Activity  . Alcohol use: Yes    Alcohol/week: 1.0 standard drinks    Types: 1 Shots of liquor per week    Comment: frequently  . Drug use: Never  . Sexual activity: Yes  Lifestyle  . Physical activity:    Days per week: 0 days    Minutes per session: 0 min  . Stress: Not at all  Relationships  . Social connections:    Talks on phone: More than three times a week    Gets together: More than three times a week    Attends religious service: Not on file    Active member of club or organization: Not on file    Attends meetings of clubs or organizations: Not on file    Relationship status: Not on file  . Intimate partner violence:    Fear of current or ex partner: Not on file    Emotionally abused: Not on file    Physically abused: Not on file    Forced sexual activity: Not on file  Other Topics Concern  . Not on file  Social History  Narrative   Been with current boyfriend since 2016    Outpatient Encounter Medications as of 04/06/2018  Medication Sig  . aspirin (BAYER ASPIRIN) 325 MG tablet Take 325 mg by mouth daily.  Marland Kitchen atorvastatin (LIPITOR) 40 MG tablet Take 1 tablet (40 mg total) by mouth daily at 2 PM.  . docusate sodium (COLACE) 100 MG capsule Take 100 mg by mouth daily.  Marland Kitchen esomeprazole (NEXIUM) 20 MG capsule Take 20 mg by mouth daily at 12 noon.  . fenoprofen (NALFON) 600 MG TABS tablet Take 600 mg by mouth.  Marland Kitchen FLUoxetine (PROZAC) 20 MG capsule Take 1 capsule (20 mg total) by mouth daily.  Marland Kitchen levothyroxine (SYNTHROID, LEVOTHROID) 88 MCG tablet Take 1 tablet (88 mcg total) by mouth daily before breakfast.  . montelukast (SINGULAIR) 10 MG tablet Take 1 tablet (10 mg total) by mouth at bedtime.  Marland Kitchen OVER  THE COUNTER MEDICATION Preser Vision  . Polyethylene Glycol 3350 (MIRALAX PO) Take by mouth.  . [DISCONTINUED] mirabegron ER (MYRBETRIQ) 25 MG TB24 tablet Take 1 tablet (25 mg total) by mouth daily.   No facility-administered encounter medications on file as of 04/06/2018.     Allergies  Allergen Reactions  . Nsaids Other (See Comments)    GI bleeding and Barrett's Esophagus  . Phenergan [Promethazine Hcl] Other (See Comments)    tongue swelling     Review of Systems  Constitutional: Negative for activity change, chills, fatigue and fever.  Respiratory: Negative for shortness of breath.   Cardiovascular: Negative for chest pain and palpitations.  Gastrointestinal: Positive for abdominal pain (lower abdominal pressure). Negative for constipation, diarrhea, nausea and vomiting.  Genitourinary: Positive for frequency, pelvic pain (pelvic pressure) and urgency. Negative for decreased urine volume, difficulty urinating, dyspareunia, dysuria, enuresis, flank pain, genital sores, hematuria, vaginal bleeding, vaginal discharge and vaginal pain.  Neurological: Negative for headaches.  Psychiatric/Behavioral: Negative for confusion.  All other systems reviewed and are negative.       Objective:    BP 128/87 (BP Location: Left Arm)   Pulse 74   Temp (!) 96.6 F (35.9 C) (Oral)   Ht _0  (1.626 m)   Wt 153 lb (69.4 kg)   BMI 26.26 kg/m    Wt Readings from Last 3 Encounters:  04/06/18 153 lb (69.4 kg)  02/22/18 153 lb (69.4 kg)  01/17/18 151 lb 9.6 oz (68.8 kg)    Physical Exam  Constitutional: She is oriented to person, place, and time. She appears well-developed and well-nourished. She is cooperative. No distress.  HENT:  Head: Normocephalic.  Cardiovascular: Normal rate, regular rhythm and normal heart sounds. Exam reveals no gallop and no friction rub.  No murmur heard. Pulmonary/Chest: Effort normal and breath sounds normal. No respiratory distress.  Abdominal: Soft.  Normal appearance and bowel sounds are normal. She exhibits no distension and no mass. There is no hepatosplenomegaly. There is no tenderness. There is no rebound, no guarding and no CVA tenderness. No hernia.  Neurological: She is alert and oriented to person, place, and time.  Skin: Skin is warm and dry. Capillary refill takes less than 2 seconds.  Psychiatric: She has a normal mood and affect. Her behavior is normal. Judgment and thought content normal.  Nursing note and vitals reviewed.   Results for orders placed or performed in visit on 01/17/18  CBC with Differential/Platelet  Result Value Ref Range   WBC 5.5 3.4 - 10.8 x10E3/uL   RBC 4.11 3.77 - 5.28 x10E6/uL   Hemoglobin  12.6 11.1 - 15.9 g/dL   Hematocrit 38.7 34.0 - 46.6 %   MCV 94 79 - 97 fL   MCH 30.7 26.6 - 33.0 pg   MCHC 32.6 31.5 - 35.7 g/dL   RDW 13.7 12.3 - 15.4 %   Platelets 304 150 - 450 x10E3/uL   Neutrophils 58 Not Estab. %   Lymphs 30 Not Estab. %   Monocytes 10 Not Estab. %   Eos 2 Not Estab. %   Basos 0 Not Estab. %   Neutrophils Absolute 3.2 1.4 - 7.0 x10E3/uL   Lymphocytes Absolute 1.6 0.7 - 3.1 x10E3/uL   Monocytes Absolute 0.6 0.1 - 0.9 x10E3/uL   EOS (ABSOLUTE) 0.1 0.0 - 0.4 x10E3/uL   Basophils Absolute 0.0 0.0 - 0.2 x10E3/uL   Immature Granulocytes 0 Not Estab. %   Immature Grans (Abs) 0.0 0.0 - 0.1 x10E3/uL  CMP14+EGFR  Result Value Ref Range   Glucose 89 65 - 99 mg/dL   BUN 17 8 - 27 mg/dL   Creatinine, Ser 0.83 0.57 - 1.00 mg/dL   GFR calc non Af Amer 72 >59 mL/min/1.73   GFR calc Af Amer 83 >59 mL/min/1.73   BUN/Creatinine Ratio 20 12 - 28   Sodium 145 (H) 134 - 144 mmol/L   Potassium 4.1 3.5 - 5.2 mmol/L   Chloride 106 96 - 106 mmol/L   CO2 24 20 - 29 mmol/L   Calcium 9.4 8.7 - 10.3 mg/dL   Total Protein 6.5 6.0 - 8.5 g/dL   Albumin 4.5 3.5 - 4.8 g/dL   Globulin, Total 2.0 1.5 - 4.5 g/dL   Albumin/Globulin Ratio 2.3 (H) 1.2 - 2.2   Bilirubin Total 0.6 0.0 - 1.2 mg/dL   Alkaline  Phosphatase 60 39 - 117 IU/L   AST 21 0 - 40 IU/L   ALT 18 0 - 32 IU/L  Lipid panel  Result Value Ref Range   Cholesterol, Total 179 100 - 199 mg/dL   Triglycerides 66 0 - 149 mg/dL   HDL 67 >39 mg/dL   VLDL Cholesterol Cal 13 5 - 40 mg/dL   LDL Calculated 99 0 - 99 mg/dL   Chol/HDL Ratio 2.7 0.0 - 4.4 ratio  TSH  Result Value Ref Range   TSH 0.741 0.450 - 4.500 uIU/mL     Urinalysis with 1+ leukocytes, negative nitrites, negative blood, and epithelial cells.  Pertinent labs & imaging results that were available during my care of the patient were reviewed by me and considered in my medical decision making.  Assessment & Plan:  Teresa Frederick was seen today for urinary tract infection.  Diagnoses and all orders for this visit:  Dysuria -     Urine Culture -     Urinalysis, Complete  Frequency of urination Urinalysis in office positive for 1+ leukocytes, negative for blood and nitrites. Culture pending. Pt will schedule a follow up appointment with her GYN/Urologist.  Increase water intake. Avoid bladder irritants. Report any new or worsening symptoms.     Continue all other maintenance medications.  Follow up plan: Return in about 4 weeks (around 05/04/2018), or if symptoms worsen or fail to improve.  Educational handout given for urinary frequency  The above assessment and management plan was discussed with the patient. The patient verbalized understanding of and has agreed to the management plan. Patient is aware to call the clinic if symptoms persist or worsen. Patient is aware when to return to the clinic for a follow-up visit. Patient educated  on when it is appropriate to go to the emergency department.   Monia Pouch, FNP-C [facility] [phone]

## 2018-04-06 NOTE — Patient Instructions (Signed)

## 2018-04-08 ENCOUNTER — Other Ambulatory Visit: Payer: Self-pay | Admitting: Family Medicine

## 2018-04-08 DIAGNOSIS — B9620 Unspecified Escherichia coli [E. coli] as the cause of diseases classified elsewhere: Secondary | ICD-10-CM

## 2018-04-08 DIAGNOSIS — N390 Urinary tract infection, site not specified: Principal | ICD-10-CM

## 2018-04-08 LAB — URINE CULTURE

## 2018-04-08 MED ORDER — SULFAMETHOXAZOLE-TRIMETHOPRIM 800-160 MG PO TABS: 1.0000 | ORAL_TABLET | Freq: Two times a day (BID) | ORAL | 0 refills | Status: AC

## 2018-04-08 NOTE — Progress Notes (Signed)
Urine culture revealed E. Coli, will treat with bactrim.

## 2018-04-16 ENCOUNTER — Other Ambulatory Visit: Payer: Self-pay | Admitting: Family Medicine

## 2018-04-16 DIAGNOSIS — E039 Hypothyroidism, unspecified: Secondary | ICD-10-CM

## 2018-05-18 ENCOUNTER — Ambulatory Visit (INDEPENDENT_AMBULATORY_CARE_PROVIDER_SITE_OTHER): Payer: Medicare Other | Admitting: Family Medicine

## 2018-05-18 ENCOUNTER — Encounter: Payer: Self-pay | Admitting: Family Medicine

## 2018-05-18 VITALS — BP 113/76 | HR 55 | Temp 97.4°F | Ht 64.0 in | Wt 150.2 lb

## 2018-05-18 DIAGNOSIS — J40 Bronchitis, not specified as acute or chronic: Secondary | ICD-10-CM

## 2018-05-18 NOTE — Progress Notes (Signed)
BP 113/76   Pulse (!) 55   Temp (!) 97.4 F (36.3 C) (Oral)   Ht 5\' 4"  (1.626 m)   Wt 150 lb 3.2 oz (68.1 kg)   BMI 25.78 kg/m    Subjective:    Patient ID: Teresa Frederick, female    DOB: 30-Apr-1946, 73 y.o.   MRN: [id number]  HPI: Teresa Frederick is a 73 y.o. female presenting on 05/18/2018 for Cough; Headache; and Nasal Congestion   HPI Cough and headache and chest congestion Patient comes in complaining of cough and headache and chest congestion is been going on for the past 4 or 5 days that she thinks she got from her daughter when she was up with her daughter over the holidays and it was gradually worsening until 2 days ago which was the worst day and then over the past couple days it has been improving.  She has been using the Mucinex and the Tylenol which she does feel like have been helping significantly.  She denies any fevers or chills  Relevant past medical, surgical, family and social history reviewed and updated as indicated. Interim medical history since our last visit reviewed. Allergies and medications reviewed and updated.  Review of Systems  Constitutional: Negative for chills and fever.  HENT: Positive for congestion, postnasal drip, rhinorrhea, sinus pressure, sneezing and sore throat. Negative for ear discharge and ear pain.   Eyes: Negative for pain, redness and visual disturbance.  Respiratory: Positive for cough. Negative for chest tightness, shortness of breath and wheezing.   Cardiovascular: Negative for chest pain and leg swelling.  Musculoskeletal: Negative for back pain and gait problem.  Skin: Negative for rash.  Neurological: Negative for light-headedness and headaches.  Psychiatric/Behavioral: Negative for agitation and behavioral problems.  All other systems reviewed and are negative.   Per HPI unless specifically indicated above   Allergies as of 05/18/2018      Reactions   Nsaids Other (See Comments)   GI bleeding and Barrett's Esophagus     Phenergan [promethazine Hcl] Other (See Comments)   tongue swelling       Medication List       Accurate as of May 18, 2018  5:29 PM. Always use your most recent med list.        atorvastatin 40 MG tablet Commonly known as:  LIPITOR Take 1 tablet (40 mg total) by mouth daily at 2 PM.   BAYER ASPIRIN 325 MG tablet Generic drug:  aspirin Take 325 mg by mouth daily.   CALCIUM 600+D PO Take by mouth.   docusate sodium 100 MG capsule Commonly known as:  COLACE Take 100 mg by mouth daily.   esomeprazole 20 MG capsule Commonly known as:  NEXIUM Take 20 mg by mouth daily at 12 noon.   FLUoxetine 20 MG capsule Commonly known as:  PROZAC Take 1 capsule (20 mg total) by mouth daily.   levothyroxine 88 MCG tablet Commonly known as:  SYNTHROID, LEVOTHROID TAKE 1 TABLET (88 MCG TOTAL) BY MOUTH DAILY BEFORE BREAKFAST.   MIRALAX PO Take by mouth.   montelukast 10 MG tablet Commonly known as:  SINGULAIR Take 1 tablet (10 mg total) by mouth at bedtime.   OVER THE COUNTER MEDICATION [employer]          Objective:    BP 113/76   Pulse (!) 55   Temp (!) 97.4 F (36.3 C) (Oral)   Ht 5\' 4"  (1.626 m)   Wt 150 lb 3.2 oz (  68.1 kg)   BMI 25.78 kg/m   Wt Readings from Last 3 Encounters:  05/18/18 150 lb 3.2 oz (68.1 kg)  04/06/18 153 lb (69.4 kg)  02/22/18 153 lb (69.4 kg)    Physical Exam Vitals signs reviewed.  Constitutional:      General: She is not in acute distress.    Appearance: She is well-developed. She is not diaphoretic.  HENT:     Right Ear: Tympanic membrane, ear canal and external ear normal.     Left Ear: Tympanic membrane, ear canal and external ear normal.     Nose: Mucosal edema present. No rhinorrhea.     Right Sinus: No maxillary sinus tenderness or frontal sinus tenderness.     Left Sinus: No maxillary sinus tenderness or frontal sinus tenderness.     Mouth/Throat:     Pharynx: Uvula midline. Pharyngeal swelling present. No  oropharyngeal exudate or posterior oropharyngeal erythema.     Tonsils: No tonsillar abscesses.  Eyes:     Conjunctiva/sclera: Conjunctivae normal.  Cardiovascular:     Rate and Rhythm: Normal rate and regular rhythm.     Heart sounds: Normal heart sounds. No murmur.  Pulmonary:     Effort: Pulmonary effort is normal. No respiratory distress.     Breath sounds: Normal breath sounds. No wheezing.  Musculoskeletal: Normal range of motion.        General: No tenderness.  Skin:    General: Skin is warm and dry.     Findings: No rash.  Neurological:     Mental Status: She is alert and oriented to person, place, and time.     Coordination: Coordination normal.  Psychiatric:        Behavior: Behavior normal.         Assessment & Plan:   Problem List Items Addressed This Visit    None    Visit Diagnoses    Bronchitis    -  Primary      Patient has bronchitis but she feels like it is improving she has been using Tylenol Mucinex and recommended maybe adding some Flonase and calling us back if it is getting worse or if she develops fevers or if it is not improved by 4 days from now. Follow up plan: Return if symptoms worsen or fail to improve.  Counseling provided for all of the vaccine components No orders of the defined types were placed in this encounter.   Caryl Pina, MD [facility] 05/18/2018, 5:29 PM

## 2018-05-24 ENCOUNTER — Telehealth: Payer: Self-pay | Admitting: Family Medicine

## 2018-05-24 MED ORDER — BENZONATATE 200 MG PO CAPS: 200.0000 mg | ORAL_CAPSULE | Freq: Two times a day (BID) | ORAL | 0 refills | Status: DC | PRN

## 2018-05-24 NOTE — Telephone Encounter (Signed)
Patient aware.

## 2018-05-24 NOTE — Telephone Encounter (Signed)
Sent [employer] and for the patient

## 2018-07-18 ENCOUNTER — Ambulatory Visit (INDEPENDENT_AMBULATORY_CARE_PROVIDER_SITE_OTHER): Payer: Medicare Other | Admitting: Family Medicine

## 2018-07-18 ENCOUNTER — Ambulatory Visit: Payer: Medicare Other | Admitting: Family Medicine

## 2018-07-18 ENCOUNTER — Encounter: Payer: Self-pay | Admitting: Family Medicine

## 2018-07-18 DIAGNOSIS — E039 Hypothyroidism, unspecified: Secondary | ICD-10-CM

## 2018-07-18 DIAGNOSIS — E782 Mixed hyperlipidemia: Secondary | ICD-10-CM | POA: Diagnosis not present

## 2018-07-18 DIAGNOSIS — F411 Generalized anxiety disorder: Secondary | ICD-10-CM | POA: Diagnosis not present

## 2018-07-18 DIAGNOSIS — J309 Allergic rhinitis, unspecified: Secondary | ICD-10-CM

## 2018-07-18 MED ORDER — FLUOXETINE HCL 20 MG PO CAPS: 20.0000 mg | ORAL_CAPSULE | Freq: Every day | ORAL | 3 refills | Status: DC

## 2018-07-18 MED ORDER — ATORVASTATIN CALCIUM 40 MG PO TABS: 40.0000 mg | ORAL_TABLET | Freq: Every day | ORAL | 3 refills | Status: DC

## 2018-07-18 MED ORDER — LEVOTHYROXINE SODIUM 88 MCG PO TABS: 88.0000 ug | ORAL_TABLET | Freq: Every day | ORAL | 3 refills | Status: DC

## 2018-07-18 MED ORDER — MONTELUKAST SODIUM 10 MG PO TABS: 10.0000 mg | ORAL_TABLET | Freq: Every day | ORAL | 3 refills | Status: DC

## 2018-07-18 NOTE — Progress Notes (Signed)
BP 121/74   Pulse (!) 51   Temp (!) 96.7 F (35.9 C) (Oral)   Ht 5' 4"  (1.626 m)   Wt 156 lb (70.8 kg)   BMI 26.78 kg/m    Subjective:    Patient ID: Teresa Frederick, female    DOB: [birth year], 73 y.o.   MRN: [id number]  HPI: Teresa Frederick is a 73 y.o. female presenting on 07/18/2018 for Hypothyroidism (6 month follow up); Gastroesophageal Reflux; Urinary Frequency (Patient states that she went to urology but states they didn't give her any answers ); Cough (Patient states it has been going on since Christmas); and Palpitations (Patient states she has woke up in the middle of the night with her heart racing- NO chest pain)   HPI Hypothyroidism recheck Patient is coming in for thyroid recheck today as well. They deny any issues with hair changes or heat or cold problems or diarrhea or constipation. They deny any chest pain or palpitations. They are currently on levothyroxine 30mcrograms   Hyperlipidemia Patient is coming in for recheck of his hyperlipidemia. The patient is currently taking Lipitor. They deny any issues with myalgias or history of liver damage from it. They deny any focal numbness or weakness or chest pain.   Anxiety Patient is coming in for anxiety recheck.  She is currently taking Prozac and says that it is doing well for her.  She denies any suicidal ideations or major depression.  She is been very content with the Prozac and feels like it is doing very well for her.  She has been on it for some years. [facility] 07/18/2018 05/18/2018 04/06/2018 01/17/2018  Decreased Interest 0 0 0 0  Down, Depressed, Hopeless 0 0 0 0  PHQ - 2 Score 0 0 0 0    Patient has chronic allergies and needs a refill of Singulair, the Singulair seems to do pretty well in controlling it for her.  Relevant past medical, surgical, family and social history reviewed and updated as indicated. Interim medical history since our last visit reviewed. Allergies and medications reviewed  and updated.  Review of Systems  Constitutional: Negative for chills and fever.  Eyes: Negative for redness and visual disturbance.  Respiratory: Negative for chest tightness and shortness of breath.   Cardiovascular: Negative for chest pain and leg swelling.  Musculoskeletal: Negative for back pain and gait problem.  Skin: Negative for rash.  Neurological: Negative for light-headedness and headaches.  Psychiatric/Behavioral: Negative for agitation, behavioral problems, dysphoric mood, self-injury, sleep disturbance and suicidal ideas. The patient is nervous/anxious.   All other systems reviewed and are negative.   Per HPI unless specifically indicated above   Allergies as of 07/18/2018      Reactions   Nsaids Other (See Comments)   GI bleeding and Barrett's Esophagus   Phenergan [promethazine Hcl] Other (See Comments)   tongue swelling       Medication List       Accurate as of July 18, 2018 11:59 PM. Always use your most recent med list.        atorvastatin 40 MG tablet Commonly known as:  LIPITOR Take 1 tablet (40 mg total) by mouth daily at 2 PM.   Bayer Aspirin 325 MG tablet Generic drug:  aspirin Take 325 mg by mouth daily.   CALCIUM 600+D PO Take by mouth.   docusate sodium 100 MG capsule Commonly known as:  COLACE Take 100 mg by mouth daily.   esomeprazole 20 MG capsule  Commonly known as:  NEXIUM Take 20 mg by mouth daily at 12 noon.   FLUoxetine 20 MG capsule Commonly known as:  PROZAC Take 1 capsule (20 mg total) by mouth daily.   levothyroxine 88 MCG tablet Commonly known as:  SYNTHROID, LEVOTHROID Take 1 tablet (88 mcg total) by mouth daily before breakfast.   MIRALAX PO Take by mouth.   montelukast 10 MG tablet Commonly known as:  SINGULAIR Take 1 tablet (10 mg total) by mouth at bedtime.   OVER THE COUNTER MEDICATION Pres[employer] Vision          Objective:    BP 121/74   Pulse (!) 51   Temp (!) 96.7 F (35.9 C) (Oral)   Ht 5' 4"   (1.626 m)   Wt 156 lb (70.8 kg)   BMI 26.78 kg/m   Wt Readings from Last 3 Encounters:  07/18/18 156 lb (70.8 kg)  05/18/18 150 lb 3.2 oz (68.1 kg)  04/06/18 153 lb (69.4 kg)    Physical Exam Vitals signs and nursing note reviewed.  Constitutional:      General: She is not in acute distress.    Appearance: She is well-developed. She is not diaphoretic.  Eyes:     Conjunctiva/sclera: Conjunctivae normal.  Cardiovascular:     Rate and Rhythm: Normal rate and regular rhythm.     Heart sounds: Normal heart sounds. No murmur.  Pulmonary:     Effort: Pulmonary effort is normal. No respiratory distress.     Breath sounds: Normal breath sounds. No wheezing.  Musculoskeletal: Normal range of motion.        General: No tenderness.  Skin:    General: Skin is warm and dry.     Findings: No rash.  Neurological:     Mental Status: She is alert and oriented to person, place, and time.     Coordination: Coordination normal.  Psychiatric:        Behavior: Behavior normal.         Assessment & Plan:   Problem List Items Addressed This Visit      Endocrine   Hypothyroidism   Relevant Medications   levothyroxine (SYNTHROID, LEVOTHROID) 88 MCG tablet   Other Relevant Orders   CMP14+EGFR (Completed)   TSH (Completed)     Other   Hyperlipemia   Relevant Medications   atorvastatin (LIPITOR) 40 MG tablet   Other Relevant Orders   Lipid panel (Completed)   GAD (generalized anxiety disorder)   Relevant Medications   FLUoxetine (PROZAC) 20 MG capsule   Other Relevant Orders   CBC with Differential/Platelet (Completed)    Other Visit Diagnoses    Chronic allergic rhinitis       Relevant Medications   montelukast (SINGULAIR) 10 MG tablet       Follow up plan: Return in about 6 months (around 01/18/2019), or if symptoms worsen or fail to improve, for Thyroid and cholesterol.  Counseling provided for all of the vaccine components Orders Placed This Encounter  Procedures  . CBC  with Differential/Platelet  . CMP14+EGFR  . Lipid panel  . TSH    Caryl Pina, MD [facility] 07/25/2018, 9:09 PM

## 2018-07-19 LAB — CBC WITH DIFFERENTIAL/PLATELET
Basophils Absolute: 0.1 10*3/uL (ref 0.0–0.2)
Basos: 1 %
EOS (ABSOLUTE): 0.1 10*3/uL (ref 0.0–0.4)
Eos: 2 %
Hematocrit: 42 % (ref 34.0–46.6)
Hemoglobin: 13.7 g/dL (ref 11.1–15.9)
Immature Grans (Abs): 0 10*3/uL (ref 0.0–0.1)
Immature Granulocytes: 0 %
LYMPHS ABS: 2.2 10*3/uL (ref 0.7–3.1)
Lymphs: 33 %
MCH: 30.4 pg (ref 26.6–33.0)
MCHC: 32.6 g/dL (ref 31.5–35.7)
MCV: 93 fL (ref 79–97)
MONOS ABS: 0.7 10*3/uL (ref 0.1–0.9)
Monocytes: 11 %
Neutrophils Absolute: 3.5 10*3/uL (ref 1.4–7.0)
Neutrophils: 53 %
PLATELETS: 332 10*3/uL (ref 150–450)
RBC: 4.51 x10E6/uL (ref 3.77–5.28)
RDW: 13.3 % (ref 11.7–15.4)
WBC: 6.6 10*3/uL (ref 3.4–10.8)

## 2018-07-19 LAB — LIPID PANEL
Chol/HDL Ratio: 2.8 ratio (ref 0.0–4.4)
Cholesterol, Total: 210 mg/dL — ABNORMAL HIGH (ref 100–199)
HDL: 74 mg/dL (ref >39)
LDL Calculated: 116 mg/dL — ABNORMAL HIGH (ref 0–99)
Triglycerides: 98 mg/dL (ref 0–149)
VLDL Cholesterol Cal: 20 mg/dL (ref 5–40)

## 2018-07-19 LAB — CMP14+EGFR
ALK PHOS: 70 IU/L (ref 39–117)
ALT: 22 IU/L (ref 0–32)
AST: 26 IU/L (ref 0–40)
Albumin/Globulin Ratio: 1.6 (ref 1.2–2.2)
Albumin: 4.2 g/dL (ref 3.7–4.7)
BUN/Creatinine Ratio: 24 (ref 12–28)
BUN: 16 mg/dL (ref 8–27)
Bilirubin Total: 0.6 mg/dL (ref 0.0–1.2)
CO2: 25 mmol/L (ref 20–29)
Calcium: 9.6 mg/dL (ref 8.7–10.3)
Chloride: 103 mmol/L (ref 96–106)
Creatinine, Ser: 0.68 mg/dL (ref 0.57–1.00)
GFR calc Af Amer: 102 mL/min/{1.73_m2} (ref >59)
GFR calc non Af Amer: 88 mL/min/{1.73_m2} (ref >59)
Globulin, Total: 2.6 g/dL (ref 1.5–4.5)
Glucose: 94 mg/dL (ref 65–99)
Potassium: 4.8 mmol/L (ref 3.5–5.2)
Sodium: 142 mmol/L (ref 134–144)
Total Protein: 6.8 g/dL (ref 6.0–8.5)

## 2018-07-19 LAB — TSH: TSH: 1.11 u[IU]/mL (ref 0.450–4.500)

## 2018-12-12 ENCOUNTER — Ambulatory Visit
Admission: EM | Admit: 2018-12-12 | Discharge: 2018-12-12 | Disposition: A | Discharge status: Home / Self Care | Payer: Medicare Other | Attending: Emergency Medicine | Admitting: Emergency Medicine

## 2018-12-12 ENCOUNTER — Ambulatory Visit (INDEPENDENT_AMBULATORY_CARE_PROVIDER_SITE_OTHER): Payer: Medicare Other

## 2018-12-12 ENCOUNTER — Encounter: Payer: Self-pay | Admitting: Family

## 2018-12-12 ENCOUNTER — Other Ambulatory Visit: Payer: Self-pay

## 2018-12-12 ENCOUNTER — Ambulatory Visit (INDEPENDENT_AMBULATORY_CARE_PROVIDER_SITE_OTHER): Payer: Medicare Other | Admitting: Family

## 2018-12-12 DIAGNOSIS — R05 Cough: Secondary | ICD-10-CM | POA: Diagnosis not present

## 2018-12-12 DIAGNOSIS — R6889 Other general symptoms and signs: Secondary | ICD-10-CM

## 2018-12-12 DIAGNOSIS — J181 Lobar pneumonia, unspecified organism: Secondary | ICD-10-CM

## 2018-12-12 DIAGNOSIS — R509 Fever, unspecified: Secondary | ICD-10-CM | POA: Diagnosis not present

## 2018-12-12 DIAGNOSIS — M791 Myalgia, unspecified site: Secondary | ICD-10-CM

## 2018-12-12 DIAGNOSIS — Z20828 Contact with and (suspected) exposure to other viral communicable diseases: Secondary | ICD-10-CM

## 2018-12-12 DIAGNOSIS — Z20822 Contact with and (suspected) exposure to covid-19: Secondary | ICD-10-CM

## 2018-12-12 DIAGNOSIS — J984 Other disorders of lung: Secondary | ICD-10-CM | POA: Diagnosis not present

## 2018-12-12 DIAGNOSIS — J189 Pneumonia, unspecified organism: Secondary | ICD-10-CM

## 2018-12-12 MED ORDER — AMOXICILLIN-POT CLAVULANATE ER 1000-62.5 MG PO TB12: 1.0000 | ORAL_TABLET | Freq: Two times a day (BID) | ORAL | 0 refills | Status: AC

## 2018-12-12 MED ORDER — ALBUTEROL SULFATE HFA 108 (90 BASE) MCG/ACT IN AERS: 2.0000 | INHALATION_SPRAY | Freq: Four times a day (QID) | RESPIRATORY_TRACT | 0 refills | Status: DC | PRN

## 2018-12-12 MED ORDER — AZITHROMYCIN 250 MG PO TABS: 250.0000 mg | ORAL_TABLET | Freq: Every day | ORAL | 0 refills | Status: DC

## 2018-12-12 NOTE — ED Provider Notes (Addendum)
[facility]   [id number] 12/12/18 Arrival Time: 3254  Cc: Fever and COUGH  SUBJECTIVE:  Teresa Frederick is a 72 y.o. female who presents with fever, with tmax of 102, x 10 days and mild cough x 2-3 days.  Denies sick exposure to COVID, flu or strep.  Denies recent travel.  Daughter visited her from [place] 2 weeks prior to symptoms.  Daughter is a MA, but has not had symptoms and has not been diagnosed with COVID.  Describes cough as intermittent and dry.  Has tried tylenol with minimal relief.  Symptoms are made worse with deep breath.  Reports previous symptoms in the past when she had the flu "50 years ago."  Had a video visit today and instructed patient to come here for chest x-ray, concern for PNA.  Complains of a few episodes of diarrhea early on in symptoms, and mild rhinorrhea. Denies chills, fatigue, congestion, sore throat, SOB, wheezing, chest pain, chest pressure, nausea, vomiting, changes in bladder habits.    ROS: As per HPI.  All other pertinent ROS negative.     Past Medical History:  Diagnosis Date  . Allergy   . Anxiety   . Arthritis   . Cataract   . Clotting disorder ([place])   . GERD (gastroesophageal reflux disease)   . Heart murmur   . Hyperlipidemia   . Mitral valve prolapse   . Osteoporosis   . Thyroid disease    Past Surgical History:  Procedure Laterality Date  . ABDOMINAL HYSTERECTOMY  07/25/2017  . COSMETIC SURGERY     Allergies  Allergen Reactions  . Nsaids Other (See Comments)    GI bleeding and Barrett's Esophagus  . Phenergan [Promethazine Hcl] Other (See Comments)    tongue swelling    No current facility-administered medications on file prior to encounter.    Current Outpatient Medications on File Prior to Encounter  Medication Sig Dispense Refill  . albuterol (VENTOLIN HFA) 108 (90 Base) MCG/ACT inhaler Inhale 2 puffs into the lungs every 6 (six) hours as needed for wheezing or shortness of breath. 8 g 0  . aspirin (BAYER ASPIRIN) 325  MG tablet Take 325 mg by mouth daily.    Marland Kitchen atorvastatin (LIPITOR) 40 MG tablet Take 1 tablet (40 mg total) by mouth daily at 2 PM. 90 tablet 3  . Calcium Carbonate-Vitamin D (CALCIUM 600+D PO) Take by mouth.    . docusate sodium (COLACE) 100 MG capsule Take 100 mg by mouth daily.    Marland Kitchen esomeprazole (NEXIUM) 20 MG capsule Take 20 mg by mouth daily at 12 noon.    Marland Kitchen FLUoxetine (PROZAC) 20 MG capsule Take 1 capsule (20 mg total) by mouth daily. 90 capsule 3  . levothyroxine (SYNTHROID, LEVOTHROID) 88 MCG tablet Take 1 tablet (88 mcg total) by mouth daily before breakfast. 90 tablet 3  . montelukast (SINGULAIR) 10 MG tablet Take 1 tablet (10 mg total) by mouth at bedtime. 90 tablet 3  . OVER THE COUNTER MEDICATION [facility]    . Polyethylene Glycol 3350 (MIRALAX PO) Take by mouth.      Social History   Socioeconomic History  . Marital status: Widowed    Spouse name: Not on file  . Number of children: Not on file  . Years of education: Not on file  . Highest education level: Not on file  Occupational History    Comment: real estate investment  Social Needs  . Financial resource strain: Not hard at all  . Food  insecurity    Worry: Never true    Inability: Never true  . Transportation needs    Medical: No    Non-medical: No  Tobacco Use  . Smoking status: Never Smoker  . Smokeless tobacco: Never Used  Substance and Sexual Activity  . Alcohol use: Yes    Alcohol/week: 1.0 standard drinks    Types: 1 Shots of liquor per week    Comment: frequently  . Drug use: Never  . Sexual activity: Yes  Lifestyle  . Physical activity    Days per week: 0 days    Minutes per session: 0 min  . Stress: Not at all  Relationships  . Social connections    Talks on phone: More than three times a week    Gets together: More than three times a week    Attends religious service: Not on file    Active member of club or organization: Not on file    Attends meetings of clubs or organizations: Not on  file    Relationship status: Not on file  . Intimate partner violence    Fear of current or ex partner: Not on file    Emotionally abused: Not on file    Physically abused: Not on file    Forced sexual activity: Not on file  Other Topics Concern  . Not on file  Social History Narrative   Been with current boyfriend since 2016   Family History  Problem Relation Age of Onset  . COPD Mother   . Diabetes Mother   . Hearing loss Mother   . Hypertension Mother   . Stroke Mother 48  . Early death Father   . Heart disease Father   . Heart attack Father 39  . Obesity Sister   . Aneurysm Sister   . COPD Brother   . Cancer Grandchild        metastatic cancer from melanoma in her eye     OBJECTIVE:  Vitals:   12/12/18 1449  Pulse: 73  Temp: 98.8 F (37.1 C)  SpO2: 93%     General appearance: Alert, appears mildly fatigued, but nontoxic; speaking in full sentences without difficulty HEENT:NCAT; Ears: EACs clear, TMs pearly gray; Eyes: PERRL.  EOM grossly intact. Nose: nares patent without rhinorrhea; Throat: tonsils nonerythematous or enlarged, uvula midline  Neck: supple without LAD Lungs: Mild crackles over LLL; normal respiratory effort; mild cough present Heart: regular rate and rhythm.  Radial pulses 2+ symmetrical bilaterally Skin: warm and dry Psychological: alert and cooperative; normal mood and affect  DIAGNOSTIC STUDIES:  Dg Chest 2 View  Result Date: 12/12/2018 CLINICAL DATA:  Body aches, chills, fever and cough for 10 days. EXAM: CHEST - 2 VIEW COMPARISON:  None. FINDINGS: Patchy airspace disease is seen in the lingula and left lung base. Right lung is clear. Heart size is normal. No pneumothorax or pleural fluid. No acute or focal bony abnormality. IMPRESSION: Patchy airspace disease in the lingula and left lung base most compatible with pneumonia. Electronically Signed   By: Inge Rise M.D.   On: 12/12/2018 15:26    X-rays positive for left lobe infiltrate   I have reviewed the x-rays myself and the radiologist interpretation. I am in agreement with the radiologist interpretation.     ASSESSMENT & PLAN:  1. Suspected Covid-19 Virus Infection   2. Pneumonia of left lower lobe due to infectious organism [facility])     Meds ordered this encounter  Medications  . amoxicillin-clavulanate (AUGMENTIN  XR) 1000-62.5 MG 12 hr tablet    Sig: Take 1 tablet by mouth 2 (two) times daily for 10 days.    Dispense:  20 tablet    Refill:  0    Order Specific Question:   Supervising Provider    Answer:   Raylene Everts [[id number]]  . azithromycin (ZITHROMAX) 250 MG tablet    Sig: Take 1 tablet (250 mg total) by mouth daily. Take first 2 tablets together, then 1 every day until finished.    Dispense:  6 tablet    Refill:  0    Order Specific Question:   Supervising Provider    Answer:   Raylene Everts [[id number]]    Orders Placed This Encounter  Procedures  . Novel Coronavirus, NAA ([facility])    Standing Status:   Standing    Number of Occurrences:   1  . DG Chest 2 View    Standing Status:   Standing    Number of Occurrences:   1    Order Specific Question:   Reason for Exam (SYMPTOM  OR DIAGNOSIS REQUIRED)    Answer:   cough fever    X-rays concerning for pneumonia.  Augmentin and azithromycin prescribed.  Take as directed and to completion  COVID testing ordered.  It will take approximately 5-7 days to receive test results.   In the meantime: You should remain isolated in your home for 10 days from symptom onset AND greater than 72 hours after symptoms resolution (absence of fever without the use of fever-reducing medication and improvement in respiratory symptoms), whichever is longer Get plenty of rest and push fluids You may use OTC Zyrtec for nasal congestion, runny nose, and/or sore throat You may use OTC Flonase for nasal congestion and runny nose Use medications daily for symptom relief Use OTC medications like ibuprofen or tylenol as  needed fever or pain Call or go to the ED if you have any new or worsening symptoms such as fever, worsening cough, shortness of breath, chest tightness, chest pain, turning blue, changes in mental status, etc...  Reviewed expectations re: course of current medical issues. Questions answered. Outlined signs and symptoms indicating need for more acute intervention. Patient verbalized understanding. After Visit Summary given.          Lestine Box, PA-C 12/12/18 [place], [place], PA-C 12/12/18 1743

## 2018-12-12 NOTE — ED Triage Notes (Signed)
Pt has had cough and fever since last Monday withs some sob, covid test on Thursday no results yet

## 2018-12-12 NOTE — Progress Notes (Signed)
Virtual Visit via telephone Note Due to COVID-19 pandemic this visit was conducted virtually. This visit type was conducted due to national recommendations for restrictions regarding the COVID-19 Pandemic (e.g. social distancing, sheltering in place) in an effort to limit this patient's exposure and mitigate transmission in our community. All issues noted in this document were discussed and addressed.  A physical exam was not performed with this format.  I connected with Teresa Frederick on 12/12/18 at 11:40 AM  by telephone and verified that I am speaking with the correct person using two identifiers. Teresa Frederick is currently located at home  and boyfriend is currently with her during visit. The provider, Evelina Dun, FNP is located in their office at time of visit.  I discussed the limitations, risks, security and privacy concerns of performing an evaluation and management service by telephone and the availability of in person appointments. I also discussed with the patient that there may be a patient responsible charge related to this service. The patient expressed understanding and agreed to proceed.   History and Present Illness:  Pt calls with complaints of COVID like symptoms that started 12/02/18. She states last week she had a COVID test completed at [facility] but her results have not returned. She reports a fever everyday since 12/02/18 as high as 102.  Cough This is a new problem. The current episode started in the past 7 days. The problem has been waxing and waning. The problem occurs every few minutes. The cough is non-productive. Associated symptoms include chills, ear congestion, ear pain, a fever, headaches, myalgias, a sore throat, shortness of breath and wheezing. Pertinent negatives include no nasal congestion or postnasal drip. Treatments tried: tylenol  The treatment provided mild relief.      Review of Systems  Constitutional: Positive for chills and fever.  HENT:  Positive for ear pain and sore throat. Negative for postnasal drip.   Respiratory: Positive for cough, shortness of breath and wheezing.   Musculoskeletal: Positive for myalgias.  Neurological: Positive for headaches.  All other systems reviewed and are negative.    Observations/Objective: No SOB or distress noted  Assessment and Plan: 1. Suspected Covid-19 Virus Infection Discussed with patient- given her age and fever, I do want her to follow up at the Urgent Care for a chest x-ray to rule out pneumonia.  Continue tylenol as needed Force fluids Rest Continue to self isolate - albuterol (VENTOLIN HFA) 108 (90 Base) MCG/ACT inhaler; Inhale 2 puffs into the lungs every 6 (six) hours as needed for wheezing or shortness of breath.  Dispense: 8 g; Refill: 0 - MyChart COVID-19 home monitoring program; Future    I discussed the assessment and treatment plan with the patient. The patient was provided an opportunity to ask questions and all were answered. The patient agreed with the plan and demonstrated an understanding of the instructions.   The patient was advised to call back or seek an in-person evaluation if the symptoms worsen or if the condition fails to improve as anticipated.  The above assessment and management plan was discussed with the patient. The patient verbalized understanding of and has agreed to the management plan. Patient is aware to call the clinic if symptoms persist or worsen. Patient is aware when to return to the clinic for a follow-up visit. Patient educated on when it is appropriate to go to the emergency department.   Time call ended:  11:56AM  I provided 16 minutes of non-face-to-face time during this encounter.  Evelina Dun, FNP

## 2018-12-12 NOTE — Discharge Instructions (Addendum)
X-rays concerning for pneumonia.  Augmentin and azithromycin prescribed.  Take as directed and to completion  COVID testing ordered.  It will take approximately 5-7 days to receive test results.   In the meantime: You should remain isolated in your home for 10 days from symptom onset AND greater than 72 hours after symptoms resolution (absence of fever without the use of fever-reducing medication and improvement in respiratory symptoms), whichever is longer Get plenty of rest and push fluids You may use OTC Zyrtec for nasal congestion, runny nose, and/or sore throat You may use OTC Flonase for nasal congestion and runny nose Use medications daily for symptom relief Use OTC medications like ibuprofen or tylenol as needed fever or pain Call or go to the ED if you have any new or worsening symptoms such as fever, worsening cough, shortness of breath, chest tightness, chest pain, turning blue, changes in mental status, etc..Marland Kitchen

## 2018-12-13 LAB — NOVEL CORONAVIRUS, NAA: SARS-CoV-2, NAA: NOT DETECTED

## 2018-12-14 ENCOUNTER — Encounter (HOSPITAL_COMMUNITY): Payer: Self-pay

## 2018-12-14 ENCOUNTER — Encounter (INDEPENDENT_AMBULATORY_CARE_PROVIDER_SITE_OTHER): Payer: Self-pay

## 2018-12-15 ENCOUNTER — Encounter (INDEPENDENT_AMBULATORY_CARE_PROVIDER_SITE_OTHER): Payer: Self-pay

## 2018-12-16 ENCOUNTER — Telehealth: Payer: Self-pay

## 2018-12-16 ENCOUNTER — Encounter (INDEPENDENT_AMBULATORY_CARE_PROVIDER_SITE_OTHER): Payer: Self-pay

## 2018-12-16 NOTE — Telephone Encounter (Signed)
Pt with new onset diarrhea. Pt stated that she thinks the diarrhea is from the abx. She has had 1 stool today. Advised pt to monitor the diarrhea and to call PCP if diarrhea worsens or becomes severe or lasts greater than 7 days. Pt given sx of dehydration and was advised to call 911 for treatment. Pt verbalized understanding.

## 2018-12-17 ENCOUNTER — Encounter (INDEPENDENT_AMBULATORY_CARE_PROVIDER_SITE_OTHER): Payer: Self-pay

## 2018-12-18 ENCOUNTER — Encounter (INDEPENDENT_AMBULATORY_CARE_PROVIDER_SITE_OTHER): Payer: Self-pay

## 2018-12-19 ENCOUNTER — Encounter (INDEPENDENT_AMBULATORY_CARE_PROVIDER_SITE_OTHER): Payer: Self-pay

## 2018-12-20 ENCOUNTER — Encounter (INDEPENDENT_AMBULATORY_CARE_PROVIDER_SITE_OTHER): Payer: Self-pay

## 2018-12-21 ENCOUNTER — Encounter (INDEPENDENT_AMBULATORY_CARE_PROVIDER_SITE_OTHER): Payer: Self-pay

## 2018-12-24 ENCOUNTER — Encounter (INDEPENDENT_AMBULATORY_CARE_PROVIDER_SITE_OTHER): Payer: Self-pay

## 2018-12-30 ENCOUNTER — Other Ambulatory Visit: Payer: Self-pay | Admitting: Family

## 2018-12-30 DIAGNOSIS — Z20822 Contact with and (suspected) exposure to covid-19: Secondary | ICD-10-CM

## 2019-01-04 ENCOUNTER — Ambulatory Visit (INDEPENDENT_AMBULATORY_CARE_PROVIDER_SITE_OTHER): Payer: Medicare Other

## 2019-01-04 ENCOUNTER — Ambulatory Visit
Admission: EM | Admit: 2019-01-04 | Discharge: 2019-01-04 | Disposition: A | Discharge status: Home / Self Care | Payer: Medicare Other | Attending: Family Medicine | Admitting: Family Medicine

## 2019-01-04 ENCOUNTER — Other Ambulatory Visit: Payer: Self-pay

## 2019-01-04 ENCOUNTER — Ambulatory Visit (INDEPENDENT_AMBULATORY_CARE_PROVIDER_SITE_OTHER): Payer: Medicare Other | Admitting: Family Medicine

## 2019-01-04 DIAGNOSIS — M5489 Other dorsalgia: Secondary | ICD-10-CM

## 2019-01-04 DIAGNOSIS — R0781 Pleurodynia: Secondary | ICD-10-CM

## 2019-01-04 DIAGNOSIS — J189 Pneumonia, unspecified organism: Secondary | ICD-10-CM

## 2019-01-04 DIAGNOSIS — J181 Lobar pneumonia, unspecified organism: Secondary | ICD-10-CM | POA: Diagnosis not present

## 2019-01-04 DIAGNOSIS — R05 Cough: Secondary | ICD-10-CM

## 2019-01-04 DIAGNOSIS — R0602 Shortness of breath: Secondary | ICD-10-CM | POA: Diagnosis not present

## 2019-01-04 NOTE — ED Provider Notes (Signed)
[facility]    CSN: [id number] Arrival date & time: 01/04/19  1408      History   Chief Complaint Chief Complaint  Patient presents with  . Shortness of Breath    HPI Teresa Frederick is a 73 y.o. female history of hypothyroidism, hyperlipidemia, GERD, anxiety, presenting today for evaluation of pleuritic back pain.  Patient states that over the past day she has had discomfort in her left upper back that she has noticed with coughing, laughing as well as taking deep breaths.  She has felt slightly short of breath with exertion.  She has had a cough that has lingered since she was treated for pneumonia on 7/29.  States that cough never fully resolved.  She has not been taking anything for her cough.  She denies any fevers chills.  Has had some mild nasal congestion.  Few days ago had diarrhea, but this is not been persistent.  Denies nausea or vomiting.  Denies leg pain or leg swelling.  Denies previous DVT/PE.  Denies recent travel or immobilization.  Denies exogenous estrogen use.  Denies smoking or tobacco use.  HPI  Past Medical History:  Diagnosis Date  . Allergy   . Anxiety   . Arthritis   . Cataract   . Clotting disorder ([place])   . GERD (gastroesophageal reflux disease)   . Heart murmur   . Hyperlipidemia   . Mitral valve prolapse   . Osteoporosis   . Thyroid disease     Patient Active Problem List   Diagnosis Date Noted  . Hypothyroidism 01/17/2018  . Hyperlipemia 01/17/2018  . GERD (gastroesophageal reflux disease) 01/17/2018  . Depression, recurrent ([place]) 01/17/2018  . GAD (generalized anxiety disorder) 01/17/2018    Past Surgical History:  Procedure Laterality Date  . ABDOMINAL HYSTERECTOMY  07/25/2017  . COSMETIC SURGERY      OB History   No obstetric history on file.      Home Medications    Prior to Admission medications   Medication Sig Start Date End Date Taking? Authorizing Provider  albuterol (VENTOLIN HFA) 108 (90 Base) MCG/ACT  inhaler TAKE 2 PUFFS BY MOUTH EVERY 6 HOURS AS NEEDED FOR WHEEZE OR SHORTNESS OF BREATH 12/31/18   Dettinger, Fransisca Kaufmann, MD  aspirin (BAYER ASPIRIN) 325 MG tablet Take 325 mg by mouth daily.    [provider]  atorvastatin (LIPITOR) 40 MG tablet Take 1 tablet (40 mg total) by mouth daily at 2 PM. 07/18/18   Dettinger, Fransisca Kaufmann, MD  azithromycin (ZITHROMAX) 250 MG tablet Take 1 tablet (250 mg total) by mouth daily. Take first 2 tablets together, then 1 every day until finished. 12/12/18   [place], [place], PA-C  Calcium Carbonate-Vitamin D (CALCIUM 600+D PO) Take by mouth.    [provider]  docusate sodium (COLACE) 100 MG capsule Take 100 mg by mouth daily.    [provider]  esomeprazole (NEXIUM) 20 MG capsule Take 20 mg by mouth daily at 12 noon.    [provider]  FLUoxetine (PROZAC) 20 MG capsule Take 1 capsule (20 mg total) by mouth daily. 07/18/18   Dettinger, Fransisca Kaufmann, MD  levothyroxine (SYNTHROID, LEVOTHROID) 88 MCG tablet Take 1 tablet (88 mcg total) by mouth daily before breakfast. 07/18/18   Dettinger, Fransisca Kaufmann, MD  montelukast (SINGULAIR) 10 MG tablet Take 1 tablet (10 mg total) by mouth at bedtime. 07/18/18   Dettinger, Fransisca Kaufmann, MD  [facility]    [provider]  Polyethylene Glycol 3350 (MIRALAX PO) Take by mouth.    [provider]    Family History Family History  Problem Relation Age of Onset  . COPD Mother   . Diabetes Mother   . Hearing loss Mother   . Hypertension Mother   . Stroke Mother 23  . Early death Father   . Heart disease Father   . Heart attack Father 44  . Obesity Sister   . Aneurysm Sister   . COPD Brother   . Cancer Grandchild        metastatic cancer from melanoma in her eye    Social History Social History   Tobacco Use  . Smoking status: Never Smoker  . Smokeless tobacco: Never Used  Substance Use Topics  . Alcohol use: Yes    Alcohol/week: 1.0 standard drinks     Types: 1 Shots of liquor per week    Comment: frequently  . Drug use: Never     Allergies   Nsaids and Phenergan [promethazine hcl]   Review of Systems Review of Systems  Constitutional: Negative for activity change, appetite change, chills, fatigue and fever.  HENT: Positive for rhinorrhea. Negative for congestion, ear pain, sinus pressure, sore throat and trouble swallowing.   Eyes: Negative for discharge and redness.  Respiratory: Positive for cough. Negative for chest tightness and shortness of breath.   Cardiovascular: Negative for chest pain.  Gastrointestinal: Negative for abdominal pain, diarrhea, nausea and vomiting.  Musculoskeletal: Positive for back pain and myalgias.  Skin: Negative for rash.  Neurological: Negative for dizziness, light-headedness and headaches.     Physical Exam Triage Vital Signs ED Triage Vitals [01/04/19 1416]  Enc Vitals Group     BP 121/68     Pulse Rate (!) 56     Resp 18     Temp 98.2 F (36.8 C)     Temp Source Oral     SpO2 92 %     Weight      Height      Head Circumference      Peak Flow      Pain Score 2     Pain Loc      Pain Edu?      Excl. in [place]?    No data found.  Updated Vital Signs BP 121/68 (BP Location: Right Arm)   Pulse (!) 56   Temp 98.2 F (36.8 C) (Oral)   Resp 18   SpO2 96%   Visual Acuity Right Eye Distance:   Left Eye Distance:   Bilateral Distance:    Right Eye Near:   Left Eye Near:    Bilateral Near:     Physical Exam Vitals signs and nursing note reviewed.  Constitutional:      General: She is not in acute distress.    Appearance: She is well-developed.  HENT:     Head: Normocephalic and atraumatic.     Ears:     Comments: Bilateral ears without tenderness to palpation of external auricle, tragus and mastoid, EAC's without erythema or swelling, TM's with good bony landmarks and cone of light. Non erythematous.     Mouth/Throat:     Comments: Oral mucosa pink and moist, no tonsillar  enlargement or exudate. Posterior pharynx patent and nonerythematous, no uvula deviation or swelling. Normal phonation.  Eyes:     Conjunctiva/sclera: Conjunctivae normal.  Neck:     Musculoskeletal: Neck supple.  Cardiovascular:     Rate and Rhythm:  Normal rate and regular rhythm.     Heart sounds: No murmur.  Pulmonary:     Effort: Pulmonary effort is normal. No respiratory distress.     Breath sounds: Normal breath sounds.     Comments: Breathing comfortably at rest, CTABL, no wheezing, rales or other adventitious sounds auscultated Abdominal:     Palpations: Abdomen is soft.     Tenderness: There is no abdominal tenderness.  Skin:    General: Skin is warm and dry.  Neurological:     Mental Status: She is alert.      UC Treatments / Results  Labs (all labs ordered are listed, but only abnormal results are displayed) Labs Reviewed - No data to display  EKG   Radiology No results found.  Procedures Procedures (including critical care time)  Medications Ordered in UC Medications - No data to display  Initial Impression / Assessment and Plan / UC Course  I have reviewed the triage vital signs and the nursing notes.  Pertinent labs & imaging results that were available during my care of the patient were reviewed by me and considered in my medical decision making (see chart for details).    Pneumonia from previous x-ray appears to be significantly improved and almost cleared.  No other new lesions or concerning areas noted on x-ray.  Will call if radiology read deferring.  At this time will treat as inflammation of muscles/possible pleurisy.  Tylenol.  Patient does have a clotting disorder.  Exam not suggestive of DVT/PE at this time, other risk factors negative.  Discussed if her symptoms worsening to follow-up in emergency room to have CT scan of chest.  Discussed strict return precautions. Patient verbalized understanding and is agreeable with plan.   Final  Clinical Impressions(s) / UC Diagnoses   Final diagnoses:  Pleuritic chest pain     Discharge Instructions     Pneumonia is improved from previous xray Take Tylenol [phone] mg every 4-6 hours I will call if radiologist read of xray abnormal  Follow up in ED if symptoms worsening  Follow up with PCP if persisting     ED Prescriptions    None     Controlled Substance Prescriptions [place] Controlled Substance Registry consulted? Not Applicable   Janith [place], [place] 01/04/19 1553

## 2019-01-04 NOTE — Progress Notes (Signed)
Telephone visit  Subjective: [id number] of breath PCP: Dettinger, Fransisca Kaufmann, MD [id number] Teresa Frederick is a 73 y.o. female calls for telephone consult today. Patient provides verbal consent for consult held via phone.  Location of patient: home Location of provider: [facility] Others present for call: none  1. Shortness of breath She reports non productive cough.  Denies fevers.  She reports pain on the left upper back.  This is higher than where the pain was previously.  She was evaluated at [facility] in [place] and treated for LLL pneumonia on 7/29.  She has had negative COVID 19 test x2 now.   ROS: Per HPI  Allergies  Allergen Reactions  . Nsaids Other (See Comments)    GI bleeding and Barrett's Esophagus  . Phenergan [Promethazine Hcl] Other (See Comments)    tongue swelling    Past Medical History:  Diagnosis Date  . Allergy   . Anxiety   . Arthritis   . Cataract   . Clotting disorder ([place])   . GERD (gastroesophageal reflux disease)   . Heart murmur   . Hyperlipidemia   . Mitral valve prolapse   . Osteoporosis   . Thyroid disease     Current Outpatient Medications:  .  albuterol (VENTOLIN HFA) 108 (90 Base) MCG/ACT inhaler, TAKE 2 PUFFS BY MOUTH EVERY 6 HOURS AS NEEDED FOR WHEEZE OR SHORTNESS OF BREATH, Disp: 6.7 g, Rfl: 0 .  aspirin (BAYER ASPIRIN) 325 MG tablet, Take 325 mg by mouth daily., Disp: , Rfl:  .  atorvastatin (LIPITOR) 40 MG tablet, Take 1 tablet (40 mg total) by mouth daily at 2 PM., Disp: 90 tablet, Rfl: 3 .  azithromycin (ZITHROMAX) 250 MG tablet, Take 1 tablet (250 mg total) by mouth daily. Take first 2 tablets together, then 1 every day until finished., Disp: 6 tablet, Rfl: 0 .  Calcium Carbonate-Vitamin D (CALCIUM 600+D PO), Take by mouth., Disp: , Rfl:  .  docusate sodium (COLACE) 100 MG capsule, Take 100 mg by mouth daily., Disp: , Rfl:  .  esomeprazole (NEXIUM) 20 MG capsule, Take 20 mg by mouth daily at 12 noon., Disp: , Rfl:  .  FLUoxetine (PROZAC) 20  MG capsule, Take 1 capsule (20 mg total) by mouth daily., Disp: 90 capsule, Rfl: 3 .  levothyroxine (SYNTHROID, LEVOTHROID) 88 MCG tablet, Take 1 tablet (88 mcg total) by mouth daily before breakfast., Disp: 90 tablet, Rfl: 3 .  montelukast (SINGULAIR) 10 MG tablet, Take 1 tablet (10 mg total) by mouth at bedtime., Disp: 90 tablet, Rfl: 3 .  OVER THE COUNTER MEDICATION, Preser Vision, Disp: , Rfl:  .  Polyethylene Glycol 3350 (MIRALAX PO), Take by mouth., Disp: , Rfl:   Assessment/ Plan: 73 y.o. female   1. Pneumonia of left lower lobe due to infectious organism [facility]) I reviewed her urgent care notes, office notes and chest x-ray results.  I offered x-ray to be obtained at [facility] but the patient wishes to be seen at the urgent care again.  I have advised her to go ahead and head straight over there so that she can have a formal physical evaluation as well.  She voiced good understanding of the plan and will proceed to the urgent care center.   Start time: 1:00pm (LVM); 1:15pm End time: 1:23pm  Total time spent on patient care (including telephone call/ virtual visit): 10 minutes  [place], [place] [phone]

## 2019-01-04 NOTE — ED Triage Notes (Signed)
Pt c/o lt upper back pain on inspiration and cough with SOB on exertion x2 days. Hx of PNA last month and 2 neg COVID test.

## 2019-01-04 NOTE — ED Triage Notes (Signed)
Pt states did a tele visit and told to come here for a chest x-ray

## 2019-01-04 NOTE — Discharge Instructions (Addendum)
Pneumonia is improved from previous xray Take Tylenol [phone] mg every 4-6 hours I will call if radiologist read of xray abnormal  Follow up in ED if symptoms worsening  Follow up with PCP if persisting

## 2019-01-14 DIAGNOSIS — Z23 Encounter for immunization: Secondary | ICD-10-CM | POA: Diagnosis not present

## 2019-01-22 ENCOUNTER — Other Ambulatory Visit: Payer: Self-pay

## 2019-01-23 ENCOUNTER — Ambulatory Visit (INDEPENDENT_AMBULATORY_CARE_PROVIDER_SITE_OTHER): Payer: Medicare Other | Admitting: Family Medicine

## 2019-01-23 ENCOUNTER — Encounter: Payer: Self-pay | Admitting: Family Medicine

## 2019-01-23 VITALS — BP 120/70 | HR 54 | Temp 98.4°F | Ht 64.0 in | Wt 151.4 lb

## 2019-01-23 DIAGNOSIS — K219 Gastro-esophageal reflux disease without esophagitis: Secondary | ICD-10-CM | POA: Diagnosis not present

## 2019-01-23 DIAGNOSIS — E039 Hypothyroidism, unspecified: Secondary | ICD-10-CM | POA: Diagnosis not present

## 2019-01-23 DIAGNOSIS — Z1239 Encounter for other screening for malignant neoplasm of breast: Secondary | ICD-10-CM

## 2019-01-23 DIAGNOSIS — E782 Mixed hyperlipidemia: Secondary | ICD-10-CM | POA: Diagnosis not present

## 2019-01-23 NOTE — Progress Notes (Signed)
BP 120/70   Pulse (!) 54   Temp 98.4 F (36.9 C) (Temporal)   Ht _0  (1.626 m)   Wt 151 lb 6.4 oz (68.7 kg)   SpO2 98%   BMI 25.99 kg/m    Subjective:   Patient ID: Teresa Frederick, female    DOB: 1945-07-19, 73 y.o.   MRN: [id number]  HPI: Teresa Frederick is a 73 y.o. female presenting on 01/23/2019 for Hyperlipidemia (6 month follow up)   HPI Hypothyroidism recheck Patient is coming in for thyroid recheck today as well. They deny any issues with hair changes or heat or cold problems or diarrhea or constipation. They deny any chest pain or palpitations. They are currently on levothyroxine 88 micrograms   Hyperlipidemia Patient is coming in for recheck of his hyperlipidemia. The patient is currently taking atorvastatin. They deny any issues with myalgias or history of liver damage from it. They deny any focal numbness or weakness or chest pain.   GERD Patient is currently on Nexium.  She denies any major symptoms or abdominal pain or belching or burping. She denies any blood in her stool or lightheadedness or dizziness.   Depression and anxiety recheck Patient currently takes Prozac for anxiety and depression and she said despite all the coronavirus and everything going on she still feeling none and in her words "she has not killed anybody yet.".  She says her nerves been running good and she denies any major issues with it.  She denies any suicidal ideations or major mood swings. Depression screen [facility] 2/9 01/23/2019 07/18/2018 05/18/2018 04/06/2018 01/17/2018  Decreased Interest 0 0 0 0 0  Down, Depressed, Hopeless 0 0 0 0 0  PHQ - 2 Score 0 0 0 0 0     Relevant past medical, surgical, family and social history reviewed and updated as indicated. Interim medical history since our last visit reviewed. Allergies and medications reviewed and updated.  Review of Systems  Constitutional: Negative for chills and fever.  Eyes: Negative for redness and visual disturbance.  Respiratory:  Negative for chest tightness and shortness of breath.   Cardiovascular: Negative for chest pain and leg swelling.  Musculoskeletal: Negative for back pain and gait problem.  Skin: Negative for rash.  Neurological: Negative for dizziness, light-headedness and headaches.  Psychiatric/Behavioral: Negative for agitation and behavioral problems.  All other systems reviewed and are negative.   Per HPI unless specifically indicated above   Allergies as of 01/23/2019      Reactions   Nsaids Other (See Comments)   GI bleeding and Barrett's Esophagus   Phenergan [promethazine Hcl] Other (See Comments)   tongue swelling       Medication List       Accurate as of January 23, 2019 11:28 AM. If you have any questions, ask your nurse or doctor.        STOP taking these medications   albuterol 108 (90 Base) MCG/ACT inhaler Commonly known as: VENTOLIN HFA Stopped by: Fransisca Kaufmann Dettinger, MD   azithromycin 250 MG tablet Commonly known as: ZITHROMAX Stopped by: Fransisca Kaufmann Dettinger, MD     TAKE these medications   atorvastatin 40 MG tablet Commonly known as: LIPITOR Take 1 tablet (40 mg total) by mouth daily at 2 PM.   Bayer Aspirin 325 MG tablet Generic drug: aspirin Take 325 mg by mouth daily.   CALCIUM 600+D PO Take by mouth.   docusate sodium 100 MG capsule Commonly known as: COLACE Take 100 mg by  mouth daily.   esomeprazole 20 MG capsule Commonly known as: NEXIUM Take 20 mg by mouth daily at 12 noon.   FLUoxetine 20 MG capsule Commonly known as: PROZAC Take 1 capsule (20 mg total) by mouth daily.   levothyroxine 88 MCG tablet Commonly known as: SYNTHROID Take 1 tablet (88 mcg total) by mouth daily before breakfast.   MIRALAX PO Take by mouth.   montelukast 10 MG tablet Commonly known as: SINGULAIR Take 1 tablet (10 mg total) by mouth at bedtime.   OVER THE COUNTER MEDICATION Preser Vision        Objective:   BP 120/70   Pulse (!) 54   Temp 98.4 F  (36.9 C) (Temporal)   Ht _0  (1.626 m)   Wt 151 lb 6.4 oz (68.7 kg)   SpO2 98%   BMI 25.99 kg/m   Wt Readings from Last 3 Encounters:  01/23/19 151 lb 6.4 oz (68.7 kg)  07/18/18 156 lb (70.8 kg)  05/18/18 150 lb 3.2 oz (68.1 kg)    Physical Exam Vitals signs and nursing note reviewed.  Constitutional:      General: She is not in acute distress.    Appearance: She is well-developed. She is not diaphoretic.  Eyes:     Conjunctiva/sclera: Conjunctivae normal.  Cardiovascular:     Rate and Rhythm: Normal rate and regular rhythm.     Heart sounds: Normal heart sounds. No murmur.  Pulmonary:     Effort: Pulmonary effort is normal. No respiratory distress.     Breath sounds: Normal breath sounds. No wheezing.  Musculoskeletal: Normal range of motion.        General: No tenderness.  Skin:    General: Skin is warm and dry.     Findings: No rash.  Neurological:     Mental Status: She is alert and oriented to person, place, and time.     Coordination: Coordination normal.  Psychiatric:        Behavior: Behavior normal.       Assessment & Plan:   Problem List Items Addressed This Visit      Digestive   GERD (gastroesophageal reflux disease)   Relevant Orders   CMP14+EGFR     Endocrine   Hypothyroidism   Relevant Orders   TSH     Other   Hyperlipemia - Primary   Relevant Orders   Lipid panel    Other Visit Diagnoses    Breast cancer screening       Relevant Orders   MM Digital Diagnostic Bilat      No change in current medication, patient seems to be doing very well.  Continue thyroid dose and cholesterol and anxiety medication Prozac and cholesterol medication. Follow up plan: Return in about 6 months (around 07/23/2019), or if symptoms worsen or fail to improve, for Thyroid and cholesterol and depression and anxiety.  Counseling provided for all of the vaccine components Orders Placed This Encounter  Procedures  . MM Digital Diagnostic Bilat  . CMP14+EGFR   . TSH  . Lipid panel    Caryl Pina, MD [facility] 01/23/2019, 11:28 AM

## 2019-01-24 LAB — LIPID PANEL
Chol/HDL Ratio: 2.7 ratio (ref 0.0–4.4)
Cholesterol, Total: 188 mg/dL (ref 100–199)
HDL: 70 mg/dL (ref >39)
LDL Chol Calc (NIH): 103 mg/dL — ABNORMAL HIGH (ref 0–99)
Triglycerides: 82 mg/dL (ref 0–149)
VLDL Cholesterol Cal: 15 mg/dL (ref 5–40)

## 2019-01-24 LAB — CMP14+EGFR
ALT: 15 IU/L (ref 0–32)
AST: 22 IU/L (ref 0–40)
Albumin/Globulin Ratio: 2 (ref 1.2–2.2)
Albumin: 4.3 g/dL (ref 3.7–4.7)
Alkaline Phosphatase: 72 IU/L (ref 39–117)
BUN/Creatinine Ratio: 26 (ref 12–28)
BUN: 16 mg/dL (ref 8–27)
Bilirubin Total: 0.5 mg/dL (ref 0.0–1.2)
CO2: 24 mmol/L (ref 20–29)
Calcium: 9.7 mg/dL (ref 8.7–10.3)
Chloride: 104 mmol/L (ref 96–106)
Creatinine, Ser: 0.62 mg/dL (ref 0.57–1.00)
GFR calc Af Amer: 105 mL/min/{1.73_m2} (ref >59)
GFR calc non Af Amer: 91 mL/min/{1.73_m2} (ref >59)
Globulin, Total: 2.1 g/dL (ref 1.5–4.5)
Glucose: 91 mg/dL (ref 65–99)
Potassium: 4.5 mmol/L (ref 3.5–5.2)
Sodium: 142 mmol/L (ref 134–144)
Total Protein: 6.4 g/dL (ref 6.0–8.5)

## 2019-01-24 LAB — TSH: TSH: 0.33 u[IU]/mL — ABNORMAL LOW (ref 0.450–4.500)

## 2019-01-29 ENCOUNTER — Other Ambulatory Visit: Payer: Self-pay | Admitting: *Deleted

## 2019-01-29 MED ORDER — LEVOTHYROXINE SODIUM 75 MCG PO TABS: 75.0000 ug | ORAL_TABLET | Freq: Every day | ORAL | 1 refills | Status: DC

## 2019-02-12 DIAGNOSIS — Z1231 Encounter for screening mammogram for malignant neoplasm of breast: Secondary | ICD-10-CM | POA: Diagnosis not present

## 2019-03-15 DIAGNOSIS — H43813 Vitreous degeneration, bilateral: Secondary | ICD-10-CM | POA: Diagnosis not present

## 2019-03-15 DIAGNOSIS — H527 Unspecified disorder of refraction: Secondary | ICD-10-CM | POA: Diagnosis not present

## 2019-03-15 DIAGNOSIS — H353210 Exudative age-related macular degeneration, right eye, stage unspecified: Secondary | ICD-10-CM | POA: Diagnosis not present

## 2019-03-15 DIAGNOSIS — H02834 Dermatochalasis of left upper eyelid: Secondary | ICD-10-CM | POA: Diagnosis not present

## 2019-03-15 DIAGNOSIS — H35433 Paving stone degeneration of retina, bilateral: Secondary | ICD-10-CM | POA: Diagnosis not present

## 2019-03-15 DIAGNOSIS — H353121 Nonexudative age-related macular degeneration, left eye, early dry stage: Secondary | ICD-10-CM | POA: Diagnosis not present

## 2019-03-15 DIAGNOSIS — H02831 Dermatochalasis of right upper eyelid: Secondary | ICD-10-CM | POA: Diagnosis not present

## 2019-03-15 DIAGNOSIS — H25813 Combined forms of age-related cataract, bilateral: Secondary | ICD-10-CM | POA: Diagnosis not present

## 2019-03-18 DIAGNOSIS — H353132 Nonexudative age-related macular degeneration, bilateral, intermediate dry stage: Secondary | ICD-10-CM | POA: Diagnosis not present

## 2019-03-18 DIAGNOSIS — H2513 Age-related nuclear cataract, bilateral: Secondary | ICD-10-CM | POA: Diagnosis not present

## 2019-03-18 DIAGNOSIS — H35033 Hypertensive retinopathy, bilateral: Secondary | ICD-10-CM | POA: Diagnosis not present

## 2019-03-18 DIAGNOSIS — H3554 Dystrophies primarily involving the retinal pigment epithelium: Secondary | ICD-10-CM | POA: Diagnosis not present

## 2019-03-22 ENCOUNTER — Ambulatory Visit (INDEPENDENT_AMBULATORY_CARE_PROVIDER_SITE_OTHER): Payer: Medicare Other | Admitting: *Deleted

## 2019-03-22 DIAGNOSIS — Z0000 Encounter for general adult medical examination without abnormal findings: Secondary | ICD-10-CM

## 2019-03-22 NOTE — Patient Instructions (Signed)
Preventive Care 38 Years and Older, Female Preventive care refers to lifestyle choices and visits with your health care provider that can promote health and wellness. This includes:  A yearly physical exam. This is also called an annual well check.  Regular dental and eye exams.  Immunizations.  Screening for certain conditions.  Healthy lifestyle choices, such as diet and exercise. What can I expect for my preventive care visit? Physical exam Your health care provider will check:  Height and weight. These may be used to calculate body mass index (BMI), which is a measurement that tells if you are at a healthy weight.  Heart rate and blood pressure.  Your skin for abnormal spots. Counseling Your health care provider may ask you questions about:  Alcohol, tobacco, and drug use.  Emotional well-being.  Home and relationship well-being.  Sexual activity.  Eating habits.  History of falls.  Memory and ability to understand (cognition).  Work and work Statistician.  Pregnancy and menstrual history. What immunizations do I need?  Influenza (flu) vaccine  This is recommended every year. Tetanus, diphtheria, and pertussis (Tdap) vaccine  You may need a Td booster every 10 years. Varicella (chickenpox) vaccine  You may need this vaccine if you have not already been vaccinated. Zoster (shingles) vaccine  You may need this after age 33. Pneumococcal conjugate (PCV13) vaccine  One dose is recommended after age 33. Pneumococcal polysaccharide (PPSV23) vaccine  One dose is recommended after age 72. Measles, mumps, and rubella (MMR) vaccine  You may need at least one dose of MMR if you were born in 1957 or later. You may also need a second dose. Meningococcal conjugate (MenACWY) vaccine  You may need this if you have certain conditions. Hepatitis A vaccine  You may need this if you have certain conditions or if you travel or work in places where you may be exposed  to hepatitis A. Hepatitis B vaccine  You may need this if you have certain conditions or if you travel or work in places where you may be exposed to hepatitis B. Haemophilus influenzae type b (Hib) vaccine  You may need this if you have certain conditions. You may receive vaccines as individual doses or as more than one vaccine together in one shot (combination vaccines). Talk with your health care provider about the risks and benefits of combination vaccines. What tests do I need? Blood tests  Lipid and cholesterol levels. These may be checked every 5 years, or more frequently depending on your overall health.  Hepatitis C test.  Hepatitis B test. Screening  Lung cancer screening. You may have this screening every year starting at age 39 if you have a 30-pack-year history of smoking and currently smoke or have quit within the past 15 years.  Colorectal cancer screening. All adults should have this screening starting at age 36 and continuing until age 15. Your health care provider may recommend screening at age 23 if you are at increased risk. You will have tests every 1-10 years, depending on your results and the type of screening test.  Diabetes screening. This is done by checking your blood sugar (glucose) after you have not eaten for a while (fasting). You may have this done every 1-3 years.  Mammogram. This may be done every 1-2 years. Talk with your health care provider about how often you should have regular mammograms.  BRCA-related cancer screening. This may be done if you have a family history of breast, ovarian, tubal, or peritoneal cancers.  Other tests  Sexually transmitted disease (STD) testing.  Bone density scan. This is done to screen for osteoporosis. You may have this done starting at age 55. Follow these instructions at home: Eating and drinking  Eat a diet that includes fresh fruits and vegetables, whole grains, lean protein, and low-fat dairy products. Limit  your intake of foods with high amounts of sugar, saturated fats, and salt.  Take vitamin and mineral supplements as recommended by your health care provider.  Do not drink alcohol if your health care provider tells you not to drink.  If you drink alcohol: ? Limit how much you have to 0-1 drink a day. ? Be aware of how much alcohol is in your drink. In the [place] one drink equals one 12 oz bottle of beer (355 mL), one 5 oz glass of wine (148 mL), or one 1 oz glass of hard liquor (44 mL). Lifestyle  Take daily care of your teeth and gums.  Stay active. Exercise for at least 30 minutes on 5 or more days each week.  Do not use any products that contain nicotine or tobacco, such as cigarettes, e-cigarettes, and chewing tobacco. If you need help quitting, ask your health care provider.  If you are sexually active, practice safe sex. Use a condom or other form of protection in order to prevent STIs (sexually transmitted infections).  Talk with your health care provider about taking a low-dose aspirin or statin. What's next?  Go to your health care provider once a year for a well check visit.  Ask your health care provider how often you should have your eyes and teeth checked.  Stay up to date on all vaccines. This information is not intended to replace advice given to you by your health care provider. Make sure you discuss any questions you have with your health care provider. Document Released: 05/29/2015 Document Revised: 04/26/2018 Document Reviewed: 04/26/2018 Elsevier Patient Education  2020 [employer].

## 2019-03-22 NOTE — Progress Notes (Signed)
MEDICARE ANNUAL WELLNESS VISIT  03/22/2019  Telephone Visit Disclaimer This Medicare AWV was conducted by telephone due to national recommendations for restrictions regarding the COVID-19 Pandemic (e.g. social distancing).  I verified, using two identifiers, that I am speaking with Teresa Frederick or their authorized healthcare agent. I discussed the limitations, risks, security, and privacy concerns of performing an evaluation and management service by telephone and the potential availability of an in-person appointment in the future. The patient expressed understanding and agreed to proceed.   Subjective:  Teresa Frederick is a 73 y.o. female patient of Dettinger, Fransisca Kaufmann, MD who had a Medicare Annual Wellness Visit today via telephone. Teresa Frederick is Retired and lives with their boyfriend. she has 2 biologic children and 2 stepchildren. she reports that she is socially active and does interact with friends/family regularly. she is minimally physically active and enjoys playing golf and walking her dog.  Patient Care Team: Dettinger, Fransisca Kaufmann, MD as PCP - General (Family Medicine)  Advanced Directives 03/22/2019  Does Patient Have a Medical Advance Directive? Yes  Type of Paramedic of [place];Living will  Does patient want to make changes to medical advance directive? No - Patient declined  Copy of [place] in Chart? No - copy requested    Hospital Utilization Over the Past 12 Months: # of hospitalizations or ER visits: 0 # of surgeries: 0  Review of Systems    Patient reports that her overall health is unchanged compared to last year.  History obtained from chart review  Patient Reported Readings (BP, Pulse, CBG, Weight, etc) none  Pain Assessment Pain : No/denies pain     Current Medications & Allergies (verified) Allergies as of 03/22/2019      Reactions   Nsaids Other (See Comments)   GI bleeding and Barrett's Esophagus    Phenergan [promethazine Hcl] Other (See Comments)   tongue swelling       Medication List       Accurate as of March 22, 2019 11:05 AM. If you have any questions, ask your nurse or doctor.        atorvastatin 40 MG tablet Commonly known as: LIPITOR Take 1 tablet (40 mg total) by mouth daily at 2 PM.   Bayer Aspirin 325 MG tablet Generic drug: aspirin Take 325 mg by mouth daily.   CALCIUM 600+D PO Take by mouth.   docusate sodium 100 MG capsule Commonly known as: COLACE Take 100 mg by mouth daily.   esomeprazole 20 MG capsule Commonly known as: NEXIUM Take 20 mg by mouth daily at 12 noon.   FLUoxetine 20 MG capsule Commonly known as: PROZAC Take 1 capsule (20 mg total) by mouth daily.   levothyroxine 75 MCG tablet Commonly known as: SYNTHROID Take 1 tablet (75 mcg total) by mouth daily.   MIRALAX PO Take by mouth.   montelukast 10 MG tablet Commonly known as: SINGULAIR Take 1 tablet (10 mg total) by mouth at bedtime.   OVER THE COUNTER MEDICATION Preser Vision       History (reviewed): Past Medical History:  Diagnosis Date  . Allergy   . Anxiety   . Arthritis   . Cataract   . Clotting disorder ([place])   . GERD (gastroesophageal reflux disease)   . Heart murmur   . Hyperlipidemia   . Mitral valve prolapse   . Osteoporosis   . Thyroid disease    Past Surgical History:  Procedure Laterality Date  .  ABDOMINAL HYSTERECTOMY  07/25/2017  . COSMETIC SURGERY  2018   brow/eye lift   Family History  Problem Relation Age of Onset  . COPD Mother   . Diabetes Mother   . Hearing loss Mother   . Hypertension Mother   . Stroke Mother 57  . Early death Father   . Heart disease Father   . Heart attack Father 44  . Obesity Sister   . Aneurysm Sister   . COPD Brother   . Cancer Grandchild        metastatic cancer from melanoma in her eye   Social History   Socioeconomic History  . Marital status: Widowed    Spouse name: Not on file  . Number of  children: 4  . Years of education: 45  . Highest education level: High school graduate  Occupational History  . Occupation: retired    Comment: Social research officer, government  Social Needs  . Frederick resource strain: Not hard at all  . Food insecurity    Worry: Never true    Inability: Never true  . Transportation needs    Medical: No    Non-medical: No  Tobacco Use  . Smoking status: Never Smoker  . Smokeless tobacco: Never Used  Substance and Sexual Activity  . Alcohol use: Yes    Alcohol/week: 5.0 standard drinks    Types: 5 Shots of liquor per week  . Drug use: Never  . Sexual activity: Yes    Birth control/protection: Surgical  Lifestyle  . Physical activity    Days per week: 0 days    Minutes per session: 0 min  . Stress: Not at all  Relationships  . Social connections    Talks on phone: More than three times a week    Gets together: More than three times a week    Attends religious service: Never    Active member of club or organization: No    Attends meetings of clubs or organizations: Never    Relationship status: Living with partner  Other Topics Concern  . Not on file  Social History Narrative   Been with current boyfriend since 2016    Activities of Daily Living In your present state of health, do you have any difficulty performing the following activities: 03/22/2019  Hearing? Y  Comment has hearing aids but doesn't wear them all the time  Vision? 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 N  Walking or climbing stairs? N  Dressing or bathing? N  Doing errands, shopping? N  Preparing Food and eating ? N  Using the Toilet? N  In the past six months, have you accidently leaked urine? Y  Comment wears pads all the time  Do you have problems with loss of bowel control? N  Managing your Medications? N  Managing your Finances? N  Housekeeping or managing your Housekeeping? N  Some  recent data might be hidden    Patient Education/ Literacy How often do you need to have someone help you when you read instructions, pamphlets, or other written materials from your doctor or pharmacy?: 1 - Never What is the last grade level you completed in school?: 12th grade  Exercise Current Exercise Habits: The patient does not participate in regular exercise at present, Exercise limited by: None identified  Diet Patient reports consuming 2 meals a day and 1 snack(s) a day Patient reports that her primary diet is: Regular Patient reports  that she does have regular access to food.   Depression Screen PHQ 2/9 Scores 03/22/2019 01/23/2019 07/18/2018 05/18/2018 04/06/2018 01/17/2018  PHQ - 2 Score 0 0 0 0 0 0     Fall Risk Fall Risk  03/22/2019 01/23/2019 05/18/2018 04/06/2018 01/17/2018  Falls in the past year? 0 0 0 0 No     Objective:  Teresa Frederick seemed alert and oriented and she participated appropriately during our telephone visit.  Blood Pressure Weight BMI  BP Readings from Last 3 Encounters:  01/23/19 120/70  01/04/19 121/68  07/18/18 121/74   Wt Readings from Last 3 Encounters:  01/23/19 151 lb 6.4 oz (68.7 kg)  07/18/18 156 lb (70.8 kg)  05/18/18 150 lb 3.2 oz (68.1 kg)   BMI Readings from Last 1 Encounters:  01/23/19 25.99 kg/m    *Unable to obtain current vital signs, weight, and BMI due to telephone visit type  Hearing/Vision  . [place] did not seem to have difficulty with hearing/understanding during the telephone conversation . Reports that she has had a formal eye exam by an eye care professional within the past year . Reports that she has not had a formal hearing evaluation within the past year *Unable to fully assess hearing and vision during telephone visit type  Cognitive Function: 6CIT Screen 03/22/2019  What Year? 0 points  What month? 0 points  What time? 0 points  Count back from 20 0 points  Months in reverse 0 points  Repeat phrase 0 points   Total Score 0   (Normal:0-7, Significant for Dysfunction: >8)  Normal Cognitive Function Screening: Yes   Immunization & Health Maintenance Record Immunization History  Administered Date(s) Administered  . Fluad Quad(high Dose 65+) 01/14/2019  . Influenza, High Dose Seasonal PF 02/22/2018  . Td 07/30/2007  . Tdap 07/30/2007    Health Maintenance  Topic Date Due  . Hepatitis C Screening  1946-04-12  . TETANUS/TDAP  07/29/2017  . PNA vac Low Risk Adult (2 of 2 - PPSV23) 01/23/2020 (Originally 05/08/2012)  . MAMMOGRAM  02/11/2021  . COLONOSCOPY  01/18/2022  . INFLUENZA VACCINE  Completed  . DEXA SCAN  Completed       Assessment  This is a routine wellness examination for Teresa Frederick.  Health Maintenance: Due or Overdue Health Maintenance Due  Topic Date Due  . Hepatitis C Screening  11-03-1945  . TETANUS/TDAP  07/29/2017    Teresa Frederick does not need a referral for Community Assistance: Care Management:   no Social Work:    no Prescription Assistance:  no Nutrition/Diabetes Education:  no   Plan:  Personalized Goals Goals Addressed            This Visit's Progress   . DIET - INCREASE WATER INTAKE       Try to drink 6-8 glasses of water daily      Personalized Health Maintenance & Screening Recommendations  Pneumococcal vaccine  Td vaccine Advanced directives: has an advanced directive - a copy HAS NOT been provided. Shingles vaccine  Lung Cancer Screening Recommended: no (Low Dose CT Chest recommended if Age 59-80 years, 30 pack-year currently smoking OR have quit w/in past 15 years) Hepatitis C Screening recommended: no HIV Screening recommended: no  Advanced Directives: Written information was not prepared per patient's request.  Referrals & Orders No orders of the defined types were placed in this encounter.   Follow-up Plan . Follow-up with Dettinger, Fransisca Kaufmann, MD as planned . Consider Prevnar13, TDAP and Shingles  vaccines at your  next visit with your PCP . Bring a copy of your Advanced Directives in for our records   I have personally reviewed and noted the following in the patient's chart:   . Medical and social history . Use of alcohol, tobacco or illicit drugs  . Current medications and supplements . Functional ability and status . Nutritional status . Physical activity . Advanced directives . List of other physicians . Hospitalizations, surgeries, and ER visits in previous 12 months . Vitals . Screenings to include cognitive, depression, and falls . Referrals and appointments  In addition, I have reviewed and discussed with Teresa Frederick certain preventive protocols, quality metrics, and best practice recommendations. A written personalized care plan for preventive services as well as general preventive health recommendations is available and can be mailed to the patient at her request.      Milas Hock, LPN  [id number]

## 2019-04-28 ENCOUNTER — Emergency Department (HOSPITAL_COMMUNITY)
Admission: EM | Admit: 2019-04-28 | Discharge: 2019-04-28 | Disposition: A | Discharge status: Home / Self Care | Payer: Medicare Other | Attending: Emergency Medicine | Admitting: Emergency Medicine

## 2019-04-28 ENCOUNTER — Encounter (HOSPITAL_COMMUNITY): Payer: Self-pay | Admitting: Emergency Medicine

## 2019-04-28 ENCOUNTER — Other Ambulatory Visit: Payer: Self-pay

## 2019-04-28 ENCOUNTER — Emergency Department (HOSPITAL_COMMUNITY): Payer: Medicare Other

## 2019-04-28 DIAGNOSIS — E039 Hypothyroidism, unspecified: Secondary | ICD-10-CM | POA: Insufficient documentation

## 2019-04-28 DIAGNOSIS — Y92018 Other place in single-family (private) house as the place of occurrence of the external cause: Secondary | ICD-10-CM | POA: Insufficient documentation

## 2019-04-28 DIAGNOSIS — Z79899 Other long term (current) drug therapy: Secondary | ICD-10-CM | POA: Insufficient documentation

## 2019-04-28 DIAGNOSIS — Y9301 Activity, walking, marching and hiking: Secondary | ICD-10-CM | POA: Diagnosis not present

## 2019-04-28 DIAGNOSIS — Y998 Other external cause status: Secondary | ICD-10-CM | POA: Diagnosis not present

## 2019-04-28 DIAGNOSIS — M542 Cervicalgia: Secondary | ICD-10-CM | POA: Diagnosis not present

## 2019-04-28 DIAGNOSIS — G501 Atypical facial pain: Secondary | ICD-10-CM | POA: Diagnosis not present

## 2019-04-28 DIAGNOSIS — W19XXXA Unspecified fall, initial encounter: Secondary | ICD-10-CM | POA: Diagnosis not present

## 2019-04-28 NOTE — ED Triage Notes (Signed)
Patient c/o neck pain that radiates into head and temples bilaterally. Per patient fell Thursday after missing a step and hitting side of face on concrete carport. Patient has bruising to right side of face. Patient denies any LOC, dizziness, blurred vision, nausea, or vomiting. Pain in neck increases with movement. Patient does state numbness to right side of face that is improving. CNS intact. Patient takes aspirin daily but no other anticoagulants. Patient taking tylenol for pain, last took at 9am with some relief.

## 2019-04-28 NOTE — ED Provider Notes (Signed)
[facility] EMERGENCY DEPARTMENT Provider Note   CSN: [id number] Arrival date & time: 04/28/19  1005     History Chief Complaint  Patient presents with  . Neck Pain    Teresa Frederick is a 73 y.o. female.  Patient states that she fell on Thursday and hit the right side of her face.  Patient complains of neck pain and facial pain.  No loss of consciousness  The history is provided by the patient. No language interpreter was used.  Neck Pain Pain location:  Generalized neck Quality:  Aching Pain radiates to:  Does not radiate Pain severity:  Mild Pain is:  Same all the time Onset quality:  Sudden Timing:  Constant Progression:  Waxing and waning Associated symptoms: no chest pain and no headaches        Past Medical History:  Diagnosis Date  . Allergy   . Anxiety   . Arthritis   . Cataract   . Clotting disorder ([place])   . GERD (gastroesophageal reflux disease)   . Heart murmur   . Hyperlipidemia   . Mitral valve prolapse   . Osteoporosis   . Thyroid disease     Patient Active Problem List   Diagnosis Date Noted  . Hypothyroidism 01/17/2018  . Hyperlipemia 01/17/2018  . GERD (gastroesophageal reflux disease) 01/17/2018  . Depression, recurrent ([place]) 01/17/2018  . GAD (generalized anxiety disorder) 01/17/2018    Past Surgical History:  Procedure Laterality Date  . ABDOMINAL HYSTERECTOMY  07/25/2017  . COSMETIC SURGERY  2018   brow/eye lift     OB History   No obstetric history on file.     Family History  Problem Relation Age of Onset  . COPD Mother   . Diabetes Mother   . Hearing loss Mother   . Hypertension Mother   . Stroke Mother 93  . Early death Father   . Heart disease Father   . Heart attack Father 79  . Obesity Sister   . Aneurysm Sister   . COPD Brother   . Cancer Grandchild        metastatic cancer from melanoma in her eye    Social History   Tobacco Use  . Smoking status: Never Smoker  . Smokeless tobacco: Never Used    Substance Use Topics  . Alcohol use: Yes    Alcohol/week: 5.0 standard drinks    Types: 5 Shots of liquor per week  . Drug use: Never    Home Medications Prior to Admission medications   Medication Sig Start Date End Date Taking? Authorizing Provider  aspirin (BAYER ASPIRIN) 325 MG tablet Take 325 mg by mouth daily.    [provider]  atorvastatin (LIPITOR) 40 MG tablet Take 1 tablet (40 mg total) by mouth daily at 2 PM. 07/18/18   Dettinger, Fransisca Kaufmann, MD  Calcium Carbonate-Vitamin D (CALCIUM 600+D PO) Take by mouth.    [provider]  docusate sodium (COLACE) 100 MG capsule Take 100 mg by mouth daily.    [provider]  esomeprazole (NEXIUM) 20 MG capsule Take 20 mg by mouth daily at 12 noon.    [provider]  FLUoxetine (PROZAC) 20 MG capsule Take 1 capsule (20 mg total) by mouth daily. 07/18/18   Dettinger, Fransisca Kaufmann, MD  levothyroxine (SYNTHROID) 75 MCG tablet Take 1 tablet (75 mcg total) by mouth daily. 01/29/19   Dettinger, Fransisca Kaufmann, MD  montelukast (SINGULAIR) 10 MG tablet Take 1 tablet (10 mg total) by mouth  at bedtime. 07/18/18   Dettinger, Fransisca Kaufmann, MD  [facility] [facility]    [provider]  Polyethylene Glycol 3350 (MIRALAX PO) Take by mouth.    [provider]    Allergies    Nsaids and Phenergan [promethazine hcl]  Review of Systems   Review of Systems  Constitutional: Negative for appetite change and fatigue.  HENT: Negative for congestion, ear discharge and sinus pressure.        Facial pain  Eyes: Negative for discharge.  Respiratory: Negative for cough.   Cardiovascular: Negative for chest pain.  Gastrointestinal: Negative for abdominal pain and diarrhea.  Genitourinary: Negative for frequency and hematuria.  Musculoskeletal: Positive for neck pain. Negative for back pain.  Skin: Negative for rash.  Neurological: Negative for seizures and headaches.  Psychiatric/Behavioral: Negative for  hallucinations.    Physical Exam Updated Vital Signs Ht 5\' 5"  (1.651 m)   Wt 68 kg   BMI 24.96 kg/m   Physical Exam Vitals and nursing note reviewed.  Constitutional:      Appearance: She is well-developed.  HENT:     Head: Normocephalic.     Comments: Bruising and tenderness to right side of face,    pupils equal round reactive to light    Nose: Nose normal.  Eyes:     General: No scleral icterus.    Conjunctiva/sclera: Conjunctivae normal.  Neck:     Thyroid: No thyromegaly.  Cardiovascular:     Rate and Rhythm: Normal rate and regular rhythm.  Pulmonary:     Breath sounds: No stridor. No wheezing or rales.  Chest:     Chest wall: No tenderness.  Abdominal:     General: There is no distension.     Tenderness: There is abdominal tenderness.  Musculoskeletal:        General: Normal range of motion.     Cervical back: Neck supple.  Lymphadenopathy:     Cervical: No cervical adenopathy.  Skin:    Findings: No erythema or rash.  Neurological:     Mental Status: She is oriented to person, place, and time.     Motor: No abnormal muscle tone.     Coordination: Coordination normal.  Psychiatric:        Behavior: Behavior normal.     ED Results / Procedures / Treatments   Labs (all labs ordered are listed, but only abnormal results are displayed) Labs Reviewed - No data to display  EKG None  Radiology CT Head Wo Contrast  Result Date: 04/28/2019 CLINICAL DATA:  Neck pain and facial bruising after fall 3 days ago. EXAM: CT HEAD WITHOUT CONTRAST CT MAXILLOFACIAL WITHOUT CONTRAST CT CERVICAL SPINE WITHOUT CONTRAST TECHNIQUE: Multidetector CT imaging of the head, cervical spine, and maxillofacial structures were performed using the standard protocol without intravenous contrast. Multiplanar CT image reconstructions of the cervical spine and maxillofacial structures were also generated. COMPARISON:  None. FINDINGS: CT HEAD FINDINGS Brain: Mild chronic ischemic white  matter disease is noted. No mass effect or midline shift is noted. Ventricular size is within normal limits. There is no evidence of mass lesion, hemorrhage or acute infarction. Vascular: No hyperdense vessel or unexpected calcification. Skull: Normal. Negative for fracture or focal lesion. Other: None. CT MAXILLOFACIAL FINDINGS Osseous: Minimally displaced fracture is seen involving the anterior wall of the right maxillary sinus, with another probable minimally displaced fracture involving its posterior portion. Fluid level is noted in the right maxillary sinus most consistent with hemorrhage. Orbits:  Negative. No traumatic or inflammatory finding. Sinuses: As noted above, probable minimally displaced fractures are seen involving the anterior and posterior portions of the right maxillary sinus. Soft tissues: Soft tissue gas is seen overlying the right mandible and maxillary regions consistent with trauma. CT CERVICAL SPINE FINDINGS Alignment: Minimal grade 1 anterolisthesis of C3-4 is noted secondary to posterior facet joint hypertrophy. Skull base and vertebrae: No acute fracture. No primary bone lesion or focal pathologic process. Soft tissues and spinal canal: No prevertebral fluid or swelling. No visible canal hematoma. Disc levels: Moderate degenerative disc disease is noted at C3-4, C5-6 and C6-7. Upper chest: Negative. Other: There appears to be fusion and hypertrophy of the bilateral facet joints of C3-4 most likely due to degenerative disease. IMPRESSION: Mild chronic ischemic white matter disease. No acute intracranial abnormality seen. Probable minimally displaced fracture seen involving the anterior and posterior walls of the right maxillary sinus, with associated probable hemorrhage within the right maxillary sinus. Extensive soft tissue gas is seen in the right mandibular and maxillary regions consistent with trauma. Moderate multilevel degenerative disc disease. No acute abnormality seen in the  cervical spine. Electronically Signed   By: Marijo Conception M.D.   On: 04/28/2019 12:06   CT Cervical Spine Wo Contrast  Result Date: 04/28/2019 CLINICAL DATA:  Neck pain and facial bruising after fall 3 days ago. EXAM: CT HEAD WITHOUT CONTRAST CT MAXILLOFACIAL WITHOUT CONTRAST CT CERVICAL SPINE WITHOUT CONTRAST TECHNIQUE: Multidetector CT imaging of the head, cervical spine, and maxillofacial structures were performed using the standard protocol without intravenous contrast. Multiplanar CT image reconstructions of the cervical spine and maxillofacial structures were also generated. COMPARISON:  None. FINDINGS: CT HEAD FINDINGS Brain: Mild chronic ischemic white matter disease is noted. No mass effect or midline shift is noted. Ventricular size is within normal limits. There is no evidence of mass lesion, hemorrhage or acute infarction. Vascular: No hyperdense vessel or unexpected calcification. Skull: Normal. Negative for fracture or focal lesion. Other: None. CT MAXILLOFACIAL FINDINGS Osseous: Minimally displaced fracture is seen involving the anterior wall of the right maxillary sinus, with another probable minimally displaced fracture involving its posterior portion. Fluid level is noted in the right maxillary sinus most consistent with hemorrhage. Orbits: Negative. No traumatic or inflammatory finding. Sinuses: As noted above, probable minimally displaced fractures are seen involving the anterior and posterior portions of the right maxillary sinus. Soft tissues: Soft tissue gas is seen overlying the right mandible and maxillary regions consistent with trauma. CT CERVICAL SPINE FINDINGS Alignment: Minimal grade 1 anterolisthesis of C3-4 is noted secondary to posterior facet joint hypertrophy. Skull base and vertebrae: No acute fracture. No primary bone lesion or focal pathologic process. Soft tissues and spinal canal: No prevertebral fluid or swelling. No visible canal hematoma. Disc levels: Moderate  degenerative disc disease is noted at C3-4, C5-6 and C6-7. Upper chest: Negative. Other: There appears to be fusion and hypertrophy of the bilateral facet joints of C3-4 most likely due to degenerative disease. IMPRESSION: Mild chronic ischemic white matter disease. No acute intracranial abnormality seen. Probable minimally displaced fracture seen involving the anterior and posterior walls of the right maxillary sinus, with associated probable hemorrhage within the right maxillary sinus. Extensive soft tissue gas is seen in the right mandibular and maxillary regions consistent with trauma. Moderate multilevel degenerative disc disease. No acute abnormality seen in the cervical spine. Electronically Signed   By: Marijo Conception M.D.   On: 04/28/2019 12:06   CT Maxillofacial Wo  Contrast  Result Date: 04/28/2019 CLINICAL DATA:  Neck pain and facial bruising after fall 3 days ago. EXAM: CT HEAD WITHOUT CONTRAST CT MAXILLOFACIAL WITHOUT CONTRAST CT CERVICAL SPINE WITHOUT CONTRAST TECHNIQUE: Multidetector CT imaging of the head, cervical spine, and maxillofacial structures were performed using the standard protocol without intravenous contrast. Multiplanar CT image reconstructions of the cervical spine and maxillofacial structures were also generated. COMPARISON:  None. FINDINGS: CT HEAD FINDINGS Brain: Mild chronic ischemic white matter disease is noted. No mass effect or midline shift is noted. Ventricular size is within normal limits. There is no evidence of mass lesion, hemorrhage or acute infarction. Vascular: No hyperdense vessel or unexpected calcification. Skull: Normal. Negative for fracture or focal lesion. Other: None. CT MAXILLOFACIAL FINDINGS Osseous: Minimally displaced fracture is seen involving the anterior wall of the right maxillary sinus, with another probable minimally displaced fracture involving its posterior portion. Fluid level is noted in the right maxillary sinus most consistent with  hemorrhage. Orbits: Negative. No traumatic or inflammatory finding. Sinuses: As noted above, probable minimally displaced fractures are seen involving the anterior and posterior portions of the right maxillary sinus. Soft tissues: Soft tissue gas is seen overlying the right mandible and maxillary regions consistent with trauma. CT CERVICAL SPINE FINDINGS Alignment: Minimal grade 1 anterolisthesis of C3-4 is noted secondary to posterior facet joint hypertrophy. Skull base and vertebrae: No acute fracture. No primary bone lesion or focal pathologic process. Soft tissues and spinal canal: No prevertebral fluid or swelling. No visible canal hematoma. Disc levels: Moderate degenerative disc disease is noted at C3-4, C5-6 and C6-7. Upper chest: Negative. Other: There appears to be fusion and hypertrophy of the bilateral facet joints of C3-4 most likely due to degenerative disease. IMPRESSION: Mild chronic ischemic white matter disease. No acute intracranial abnormality seen. Probable minimally displaced fracture seen involving the anterior and posterior walls of the right maxillary sinus, with associated probable hemorrhage within the right maxillary sinus. Extensive soft tissue gas is seen in the right mandibular and maxillary regions consistent with trauma. Moderate multilevel degenerative disc disease. No acute abnormality seen in the cervical spine. Electronically Signed   By: Marijo Conception M.D.   On: 04/28/2019 12:06    Procedures Procedures (including critical care time)  Medications Ordered in ED Medications - No data to display  ED Course  I have reviewed the triage vital signs and the nursing notes.  Pertinent labs & imaging results that were available during my care of the patient were reviewed by me and considered in my medical decision making (see chart for details).    MDM Rules/Calculators/A&P     CHA2DS2/VAS Stroke Risk Points      N/A >= 2 Points: High Risk  1 - 1.99 Points: Medium Risk   0 Points: Low Risk    A final score could not be computed because of missing components.: Last  Change: N/A     This score determines the patient's risk of having a stroke if the  patient has atrial fibrillation.      This score is not applicable to this patient. Components are not  calculated.                  CT scan of the head unremarkable CT scan of the face shows possible sinus fractures.  CT scan of cervical spine negative.  Patient will take Tylenol follow-up with Dr. Skipper Cliche who is on-call for facial trauma Final Clinical Impression(s) / ED Diagnoses Final diagnoses:  Fall, initial encounter    Rx / DC Orders ED Discharge Orders    None       Milton Ferguson, MD 04/28/19 1323

## 2019-04-28 NOTE — Discharge Instructions (Addendum)
Follow-up with Dr. Mancel Parsons and 1 to 2 weeks for recheck.  Take Tylenol for pain

## 2019-05-29 DIAGNOSIS — S0219XA Other fracture of base of skull, initial encounter for closed fracture: Secondary | ICD-10-CM | POA: Diagnosis not present

## 2019-07-02 ENCOUNTER — Telehealth: Payer: Self-pay | Admitting: Family Medicine

## 2019-07-02 DIAGNOSIS — J309 Allergic rhinitis, unspecified: Secondary | ICD-10-CM

## 2019-07-02 DIAGNOSIS — F411 Generalized anxiety disorder: Secondary | ICD-10-CM

## 2019-07-02 DIAGNOSIS — E782 Mixed hyperlipidemia: Secondary | ICD-10-CM

## 2019-07-02 MED ORDER — ATORVASTATIN CALCIUM 40 MG PO TABS: 40.0000 mg | ORAL_TABLET | Freq: Every day | ORAL | 0 refills | Status: DC

## 2019-07-02 MED ORDER — MONTELUKAST SODIUM 10 MG PO TABS: 10.0000 mg | ORAL_TABLET | Freq: Every day | ORAL | 0 refills | Status: DC

## 2019-07-02 MED ORDER — FLUOXETINE HCL 20 MG PO CAPS: 20.0000 mg | ORAL_CAPSULE | Freq: Every day | ORAL | 0 refills | Status: DC

## 2019-07-02 MED ORDER — LEVOTHYROXINE SODIUM 75 MCG PO TABS: 75.0000 ug | ORAL_TABLET | Freq: Every day | ORAL | 0 refills | Status: DC

## 2019-07-02 NOTE — Telephone Encounter (Signed)
Rx refills sent over and pt advised further refills will be given at her f/u appt with Dr Dettinger on 07/24/19.

## 2019-07-02 NOTE — Telephone Encounter (Signed)
What is the name of the medication? atorvastatin (LIPITOR) 40 MG tablet FLUoxetine (PROZAC) 20 MG capsule levothyroxine (SYNTHROID) 75 MCG tablet montelukast (SINGULAIR) 10 MG tablet   Have you contacted your pharmacy to request a refill? Yes no answer from [place]. Pt has enough for a week  Which pharmacy would you like this sent to? [facility] [phone]   Patient notified that their request is being sent to the clinical staff for review and that they should receive a call once it is complete. If they do not receive a call within 24 hours they can check with their pharmacy or our office.

## 2019-07-23 ENCOUNTER — Other Ambulatory Visit: Payer: Self-pay

## 2019-07-24 ENCOUNTER — Encounter: Payer: Self-pay | Admitting: Family Medicine

## 2019-07-24 ENCOUNTER — Ambulatory Visit (INDEPENDENT_AMBULATORY_CARE_PROVIDER_SITE_OTHER): Payer: Medicare Other | Admitting: Family Medicine

## 2019-07-24 VITALS — BP 120/62 | HR 75 | Temp 99.3°F | Ht 65.0 in | Wt 156.4 lb

## 2019-07-24 DIAGNOSIS — F339 Major depressive disorder, recurrent, unspecified: Secondary | ICD-10-CM | POA: Diagnosis not present

## 2019-07-24 DIAGNOSIS — J309 Allergic rhinitis, unspecified: Secondary | ICD-10-CM

## 2019-07-24 DIAGNOSIS — M7631 Iliotibial band syndrome, right leg: Secondary | ICD-10-CM

## 2019-07-24 DIAGNOSIS — E782 Mixed hyperlipidemia: Secondary | ICD-10-CM

## 2019-07-24 DIAGNOSIS — F411 Generalized anxiety disorder: Secondary | ICD-10-CM | POA: Diagnosis not present

## 2019-07-24 DIAGNOSIS — Z23 Encounter for immunization: Secondary | ICD-10-CM

## 2019-07-24 DIAGNOSIS — K219 Gastro-esophageal reflux disease without esophagitis: Secondary | ICD-10-CM

## 2019-07-24 DIAGNOSIS — E039 Hypothyroidism, unspecified: Secondary | ICD-10-CM | POA: Diagnosis not present

## 2019-07-24 MED ORDER — LEVOTHYROXINE SODIUM 75 MCG PO TABS: 75.0000 ug | ORAL_TABLET | Freq: Every day | ORAL | 3 refills | Status: DC

## 2019-07-24 MED ORDER — MONTELUKAST SODIUM 10 MG PO TABS: 10.0000 mg | ORAL_TABLET | Freq: Every day | ORAL | 3 refills | Status: DC

## 2019-07-24 MED ORDER — ATORVASTATIN CALCIUM 40 MG PO TABS: 40.0000 mg | ORAL_TABLET | Freq: Every day | ORAL | 3 refills | Status: DC

## 2019-07-24 MED ORDER — FLUOXETINE HCL 20 MG PO CAPS: 20.0000 mg | ORAL_CAPSULE | Freq: Every day | ORAL | 3 refills | Status: DC

## 2019-07-24 NOTE — Progress Notes (Signed)
BP 120/62   Pulse 75   Temp 99.3 F (37.4 C)   Ht 5' 5"  (1.651 m)   Wt 156 lb 6 oz (70.9 kg)   SpO2 98%   BMI 26.02 kg/m    Subjective:   Patient ID: Teresa Frederick, female    DOB: 07/13/1945, 74 y.o.   MRN: [id number]  HPI: Teresa Frederick is a 74 y.o. female presenting on 07/24/2019 for Medical Management of Chronic Issues, Hypothyroidism, Depression, and Ear Fullness   HPI Hypothyroidism recheck Patient is coming in for thyroid recheck today as well. They deny any issues with hair changes or heat or cold problems or diarrhea or constipation. They deny any chest pain or palpitations. They are currently on levothyroxine 75 micrograms   Hyperlipidemia Patient is coming in for recheck of his hyperlipidemia. The patient is currently taking atorvastatin. They deny any issues with myalgias or history of liver damage from it. They deny any focal numbness or weakness or chest pain.   GERD Patient is currently on Nexium.  She denies any major symptoms or abdominal pain or belching or burping. She denies any blood in her stool or lightheadedness or dizziness.   Anxiety depression recheck Patient feels like she is doing good with anxiety and depression and currently takes Prozac and feels like it does well for him denies any major issues and would like to continue with it.  Patient denies any suicidal ideations or thoughts to hurt herself. PHQ -1  Relevant past medical, surgical, family and social history reviewed and updated as indicated. Interim medical history since our last visit reviewed. Allergies and medications reviewed and updated.  Review of Systems  Constitutional: Negative for chills and fever.  HENT: Negative for congestion, ear discharge and ear pain.   Eyes: Negative for redness and visual disturbance.  Respiratory: Negative for chest tightness and shortness of breath.   Cardiovascular: Negative for chest pain and leg swelling.  Genitourinary: Negative for difficulty  urinating and dysuria.  Musculoskeletal: Negative for back pain and gait problem.  Skin: Negative for rash.  Neurological: Negative for light-headedness and headaches.  Psychiatric/Behavioral: Negative for agitation and behavioral problems.  All other systems reviewed and are negative.   Per HPI unless specifically indicated above   Allergies as of 07/24/2019      Reactions   Nsaids Other (See Comments)   GI bleeding and Barrett's Esophagus   Phenergan [promethazine Hcl] Other (See Comments)   tongue swelling       Medication List       Accurate as of July 24, 2019 12:04 PM. If you have any questions, ask your nurse or doctor.        atorvastatin 40 MG tablet Commonly known as: LIPITOR Take 1 tablet (40 mg total) by mouth daily at 2 PM.   Bayer Aspirin 325 MG tablet Generic drug: aspirin Take 325 mg by mouth daily.   CALCIUM 600+D PO Take by mouth.   docusate sodium 100 MG capsule Commonly known as: COLACE Take 100 mg by mouth daily.   esomeprazole 20 MG capsule Commonly known as: NEXIUM Take 20 mg by mouth daily at 12 noon.   FLUoxetine 20 MG capsule Commonly known as: PROZAC Take 1 capsule (20 mg total) by mouth daily.   levothyroxine 75 MCG tablet Commonly known as: SYNTHROID Take 1 tablet (75 mcg total) by mouth daily.   MIRALAX PO Take by mouth.   montelukast 10 MG tablet Commonly known as: SINGULAIR Take 1 tablet (  10 mg total) by mouth at bedtime.   OVER THE COUNTER MEDICATION [facility]        Objective:   BP 120/62   Pulse 75   Temp 99.3 F (37.4 C)   Ht 5' 5"  (1.651 m)   Wt 156 lb 6 oz (70.9 kg)   SpO2 98%   BMI 26.02 kg/m   Wt Readings from Last 3 Encounters:  07/24/19 156 lb 6 oz (70.9 kg)  04/28/19 150 lb (68 kg)  01/23/19 151 lb 6.4 oz (68.7 kg)    Physical Exam Vitals and nursing note reviewed.  Constitutional:      General: She is not in acute distress.    Appearance: She is well-developed. She is not  diaphoretic.  Eyes:     Conjunctiva/sclera: Conjunctivae normal.     Pupils: Pupils are equal, round, and reactive to light.  Cardiovascular:     Rate and Rhythm: Normal rate and regular rhythm.     Heart sounds: Normal heart sounds. No murmur.  Pulmonary:     Effort: Pulmonary effort is normal. No respiratory distress.     Breath sounds: Normal breath sounds. No wheezing.  Musculoskeletal:        General: No tenderness. Normal range of motion.  Skin:    General: Skin is warm and dry.     Findings: No rash.  Neurological:     Mental Status: She is alert and oriented to person, place, and time.     Coordination: Coordination normal.  Psychiatric:        Behavior: Behavior normal.       Assessment & Plan:   Problem List Items Addressed This Visit      Digestive   GERD (gastroesophageal reflux disease)   Relevant Orders   CMP14+EGFR     Endocrine   Hypothyroidism - Primary   Relevant Medications   levothyroxine (SYNTHROID) 75 MCG tablet   Other Relevant Orders   Thyroid Panel With TSH     Other   Hyperlipemia   Relevant Medications   atorvastatin (LIPITOR) 40 MG tablet   Other Relevant Orders   Lipid panel   Depression, recurrent (HCC)   Relevant Medications   FLUoxetine (PROZAC) 20 MG capsule   GAD (generalized anxiety disorder)   Relevant Medications   FLUoxetine (PROZAC) 20 MG capsule    Other Visit Diagnoses    Chronic allergic rhinitis       Relevant Medications   montelukast (SINGULAIR) 10 MG tablet   It band syndrome, right       Need for vaccine for Td (tetanus-diphtheria)       Relevant Orders   Tdap vaccine greater than or equal to 7yo IM (Completed)      Gave a list of exercises to do to help strengthen the muscle groups and stretch out the IT band.  Continue current medication, no other issues.  Patient did have 1 fall down the stairs and she tripped, she has not had it since she will be extra cautious.  We discussed this. Follow up  plan: Return in about 6 months (around 01/24/2020), or if symptoms worsen or fail to improve, for Thyroid and depression and anxiety recheck.  Counseling provided for all of the vaccine components Orders Placed This Encounter  Procedures  . Tdap vaccine greater than or equal to 7yo IM  . Lipid panel  . Thyroid Panel With TSH  . [place], MD [facility] 07/24/2019,  12:04 PM

## 2019-07-24 NOTE — Patient Instructions (Signed)
Stretches for strengthening legs Recommended leg raises both front side and back laying on stomach side and back. Stretch legs by crossing leg and pushing down on knee Recommended balancing on a leg, use something for support until stronger. Recommended toe raises or lifts, use something for support at first if needed.

## 2019-07-25 LAB — CMP14+EGFR
ALT: 14 IU/L (ref 0–32)
AST: 22 IU/L (ref 0–40)
Albumin/Globulin Ratio: 1.9 (ref 1.2–2.2)
Albumin: 4.5 g/dL (ref 3.7–4.7)
Alkaline Phosphatase: 86 IU/L (ref 39–117)
BUN/Creatinine Ratio: 28 (ref 12–28)
BUN: 22 mg/dL (ref 8–27)
Bilirubin Total: 0.2 mg/dL (ref 0.0–1.2)
CO2: 26 mmol/L (ref 20–29)
Calcium: 9.8 mg/dL (ref 8.7–10.3)
Chloride: 104 mmol/L (ref 96–106)
Creatinine, Ser: 0.78 mg/dL (ref 0.57–1.00)
GFR calc Af Amer: 88 mL/min/{1.73_m2} (ref >59)
GFR calc non Af Amer: 76 mL/min/{1.73_m2} (ref >59)
Globulin, Total: 2.4 g/dL (ref 1.5–4.5)
Glucose: 98 mg/dL (ref 65–99)
Potassium: 4.8 mmol/L (ref 3.5–5.2)
Sodium: 142 mmol/L (ref 134–144)
Total Protein: 6.9 g/dL (ref 6.0–8.5)

## 2019-07-25 LAB — THYROID PANEL WITH TSH
Free Thyroxine Index: 2.3 (ref 1.2–4.9)
T3 Uptake Ratio: 30 % (ref 24–39)
T4, Total: 7.7 ug/dL (ref 4.5–12.0)
TSH: 2.12 u[IU]/mL (ref 0.450–4.500)

## 2019-07-25 LAB — LIPID PANEL
Chol/HDL Ratio: 3 ratio (ref 0.0–4.4)
Cholesterol, Total: 216 mg/dL — ABNORMAL HIGH (ref 100–199)
HDL: 72 mg/dL (ref >39)
LDL Chol Calc (NIH): 131 mg/dL — ABNORMAL HIGH (ref 0–99)
Triglycerides: 74 mg/dL (ref 0–149)
VLDL Cholesterol Cal: 13 mg/dL (ref 5–40)

## 2019-07-29 DIAGNOSIS — S0219XA Other fracture of base of skull, initial encounter for closed fracture: Secondary | ICD-10-CM | POA: Diagnosis not present

## 2019-08-09 ENCOUNTER — Ambulatory Visit: Payer: Medicare Other | Attending: Internal Medicine

## 2019-08-09 DIAGNOSIS — Z23 Encounter for immunization: Secondary | ICD-10-CM

## 2019-08-09 NOTE — Progress Notes (Signed)
   Covid-19 Vaccination Clinic  Name:  Adanya Kittrell    MRN: [id number] DOB: 07-11-1945  08/09/2019  Ms. Bro was observed post Covid-19 immunization for 15 minutes without incident. She was provided with Vaccine Information Sheet and instruction to access the V-Safe system.   Ms. Carper was instructed to call 911 with any severe reactions post vaccine: Marland Kitchen Difficulty breathing  . Swelling of face and throat  . A fast heartbeat  . A bad rash all over body  . Dizziness and weakness   Immunizations Administered    Name Date Dose VIS Date Route   Moderna COVID-19 Vaccine 08/09/2019 10:39 AM 0.5 mL 04/16/2019 Intramuscular   Manufacturer: Moderna   Lot: [id number]   [id number]

## 2019-09-10 ENCOUNTER — Ambulatory Visit: Payer: Medicare Other | Attending: Internal Medicine

## 2019-09-10 DIAGNOSIS — Z23 Encounter for immunization: Secondary | ICD-10-CM

## 2019-09-10 NOTE — Progress Notes (Signed)
   Covid-19 Vaccination Clinic  Name:  Teresa Frederick    MRN: [id number] DOB: 11-16-1945  09/10/2019  [name] was observed post Covid-19 immunization for 15 minutes without incident. She was provided with Vaccine Information Sheet and instruction to access the V-Safe system.   Ms. Kidney was instructed to call 911 with any severe reactions post vaccine: Marland Kitchen Difficulty breathing  . Swelling of face and throat  . A fast heartbeat  . A bad rash all over body  . Dizziness and weakness   Immunizations Administered    Name Date Dose VIS Date Route   Moderna COVID-19 Vaccine 09/10/2019 12:32 PM 0.5 mL 04/2019 Intramuscular   Manufacturer: Moderna   Lot: [id number]   [id number]

## 2019-09-13 DIAGNOSIS — H3554 Dystrophies primarily involving the retinal pigment epithelium: Secondary | ICD-10-CM | POA: Diagnosis not present

## 2019-09-13 DIAGNOSIS — H527 Unspecified disorder of refraction: Secondary | ICD-10-CM | POA: Diagnosis not present

## 2019-09-26 ENCOUNTER — Ambulatory Visit (INDEPENDENT_AMBULATORY_CARE_PROVIDER_SITE_OTHER): Payer: Medicare Other | Admitting: Family Medicine

## 2019-09-26 ENCOUNTER — Encounter: Payer: Self-pay | Admitting: Family Medicine

## 2019-09-26 ENCOUNTER — Ambulatory Visit: Payer: Medicare Other | Admitting: Family Medicine

## 2019-09-26 ENCOUNTER — Other Ambulatory Visit: Payer: Self-pay

## 2019-09-26 ENCOUNTER — Ambulatory Visit: Payer: Medicare Other

## 2019-09-26 VITALS — BP 99/57 | HR 56 | Temp 96.8°F | Ht 65.0 in | Wt 146.5 lb

## 2019-09-26 DIAGNOSIS — R399 Unspecified symptoms and signs involving the genitourinary system: Secondary | ICD-10-CM

## 2019-09-26 DIAGNOSIS — R358 Other polyuria: Secondary | ICD-10-CM | POA: Diagnosis not present

## 2019-09-26 DIAGNOSIS — R3589 Other polyuria: Secondary | ICD-10-CM

## 2019-09-26 LAB — URINALYSIS, ROUTINE W REFLEX MICROSCOPIC
Bilirubin, UA: NEGATIVE
Glucose, UA: NEGATIVE
Ketones, UA: NEGATIVE
Leukocytes,UA: NEGATIVE
Nitrite, UA: NEGATIVE
Protein,UA: NEGATIVE
RBC, UA: NEGATIVE
Specific Gravity, UA: 1.01 (ref 1.005–1.030)
Urobilinogen, Ur: 0.2 mg/dL (ref 0.2–1.0)
pH, UA: 5.5 (ref 5.0–7.5)

## 2019-09-26 LAB — MICROSCOPIC EXAMINATION
RBC, Urine: NONE SEEN /hpf (ref 0–2)
Renal Epithel, UA: NONE SEEN /hpf

## 2019-09-26 MED ORDER — CEPHALEXIN 500 MG PO CAPS: 500.0000 mg | ORAL_CAPSULE | Freq: Four times a day (QID) | ORAL | 0 refills | Status: DC

## 2019-09-26 NOTE — Progress Notes (Signed)
BP (!) 99/57   Pulse (!) 56   Temp (!) 96.8 F (36 C)   Ht 5\' 5"  (1.651 m)   Wt 146 lb 8 oz (66.5 kg)   SpO2 97%   BMI 24.38 kg/m    Subjective:   Patient ID: Teresa Frederick, female    DOB: 08-Feb-1946, 74 y.o.   MRN: [id number]  HPI: Teresa Frederick is a 74 y.o. female presenting on 09/26/2019 for Urinary Tract Infection (polyuria)   HPI Patient comes in complaining of urinary frequency and polyuria and urgency that started yesterday, she says is been increasing.  She does get these urinary tract infections occasionally and she is going out of town and so she wanted to come in and get it checked before she left.  Relevant past medical, surgical, family and social history reviewed and updated as indicated. Interim medical history since our last visit reviewed. Allergies and medications reviewed and updated.  Review of Systems  Constitutional: Negative for chills and fever.  Eyes: Negative for visual disturbance.  Respiratory: Negative for chest tightness and shortness of breath.   Cardiovascular: Negative for chest pain and leg swelling.  Gastrointestinal: Negative for abdominal pain.  Genitourinary: Positive for frequency and urgency. Negative for difficulty urinating, dysuria, flank pain, hematuria, vaginal bleeding, vaginal discharge and vaginal pain.  Musculoskeletal: Negative for back pain and gait problem.  Skin: Negative for rash.  Neurological: Negative for light-headedness and headaches.  Psychiatric/Behavioral: Negative for agitation and behavioral problems.  All other systems reviewed and are negative.   Per HPI unless specifically indicated above   Objective:   BP (!) 99/57   Pulse (!) 56   Temp (!) 96.8 F (36 C)   Ht 5\' 5"  (075-GRM m)   Wt 146 lb 8 oz (66.5 kg)   SpO2 97%   BMI 24.38 kg/m   Wt Readings from Last 3 Encounters:  09/26/19 146 lb 8 oz (66.5 kg)  07/24/19 156 lb 6 oz (70.9 kg)  04/28/19 150 lb (68 kg)    Physical Exam Vitals and  nursing note reviewed.  Constitutional:      General: She is not in acute distress.    Appearance: She is well-developed. She is not diaphoretic.  Eyes:     Conjunctiva/sclera: Conjunctivae normal.     Pupils: Pupils are equal, round, and reactive to light.  Cardiovascular:     Rate and Rhythm: Normal rate and regular rhythm.     Heart sounds: Normal heart sounds. No murmur.  Pulmonary:     Effort: Pulmonary effort is normal. No respiratory distress.     Breath sounds: Normal breath sounds. No wheezing.  Abdominal:     General: Bowel sounds are normal. There is no distension.     Palpations: Abdomen is soft. Abdomen is not rigid. There is no mass.     Tenderness: There is no abdominal tenderness. There is no guarding or rebound.  Musculoskeletal:        General: No tenderness. Normal range of motion.  Skin:    General: Skin is warm and dry.     Findings: No rash.  Neurological:     Mental Status: She is alert and oriented to person, place, and time.     Coordination: Coordination normal.  Psychiatric:        Behavior: Behavior normal.     Urine dip, negative, waiting for rest of urinalysis and culture.  Assessment & Plan:   Problem List Items Addressed This Visit  None    Visit Diagnoses    Polyuria    -  Primary   Relevant Orders   Urinalysis, Routine w reflex microscopic   Urine Culture   UTI symptoms          Will treat symptomatically with Keflex. Follow up plan: Return if symptoms worsen or fail to improve.  Counseling provided for all of the vaccine components Orders Placed This Encounter  Procedures  . Urine Culture  . Urinalysis, Routine w reflex microscopic    Caryl Pina, MD [facility] 09/26/2019, 1:55 PM

## 2019-09-28 LAB — URINE CULTURE: Organism ID, Bacteria: NO GROWTH

## 2019-10-10 ENCOUNTER — Telehealth: Payer: Self-pay | Admitting: Family Medicine

## 2019-10-10 ENCOUNTER — Other Ambulatory Visit: Payer: Self-pay | Admitting: Family Medicine

## 2019-10-10 ENCOUNTER — Other Ambulatory Visit: Payer: Self-pay

## 2019-10-10 ENCOUNTER — Other Ambulatory Visit: Payer: Medicare Other

## 2019-10-10 DIAGNOSIS — R319 Hematuria, unspecified: Secondary | ICD-10-CM

## 2019-10-10 LAB — MICROSCOPIC EXAMINATION: Renal Epithel, UA: NONE SEEN /hpf

## 2019-10-10 LAB — URINALYSIS, COMPLETE
Bilirubin, UA: NEGATIVE
Glucose, UA: NEGATIVE
Ketones, UA: NEGATIVE
Nitrite, UA: NEGATIVE
Protein,UA: NEGATIVE
Specific Gravity, UA: 1.01 (ref 1.005–1.030)
Urobilinogen, Ur: 0.2 mg/dL (ref 0.2–1.0)
pH, UA: 7 (ref 5.0–7.5)

## 2019-10-10 NOTE — Telephone Encounter (Signed)
2 things, will place referral to urology, would like her to come in and leave another urinalysis and culture. Caryl Pina, MD [facility] 10/10/2019, 10:46 AM

## 2019-10-10 NOTE — Telephone Encounter (Signed)
Patient aware and verbalizes understanding. 

## 2019-10-10 NOTE — Progress Notes (Signed)
Placed orders for UA and culture

## 2019-10-11 MED ORDER — SULFAMETHOXAZOLE-TRIMETHOPRIM 800-160 MG PO TABS: 1.0000 | ORAL_TABLET | Freq: Two times a day (BID) | ORAL | 0 refills | Status: DC

## 2019-10-11 NOTE — Telephone Encounter (Signed)
Bactrim Prescription sent to pharmacy, urine culture pending

## 2019-10-11 NOTE — Telephone Encounter (Signed)
Patient aware, script is ready. 

## 2019-10-11 NOTE — Telephone Encounter (Signed)
Requesting medication for urine problem.

## 2019-10-15 ENCOUNTER — Telehealth: Payer: Self-pay | Admitting: Family Medicine

## 2019-10-15 DIAGNOSIS — Z6823 Body mass index (BMI) 23.0-23.9, adult: Secondary | ICD-10-CM | POA: Diagnosis not present

## 2019-10-15 DIAGNOSIS — Z20822 Contact with and (suspected) exposure to covid-19: Secondary | ICD-10-CM | POA: Diagnosis not present

## 2019-10-15 DIAGNOSIS — R509 Fever, unspecified: Secondary | ICD-10-CM | POA: Diagnosis not present

## 2019-10-15 DIAGNOSIS — R112 Nausea with vomiting, unspecified: Secondary | ICD-10-CM | POA: Diagnosis not present

## 2019-10-15 DIAGNOSIS — R350 Frequency of micturition: Secondary | ICD-10-CM | POA: Diagnosis not present

## 2019-10-15 LAB — URINE CULTURE

## 2019-10-15 NOTE — Telephone Encounter (Signed)
Patient has had recent Kidney stone and UTI. She stated she was going to Urgent care today to get tested for COVID. I also advised patient to let them check her urine since she was already going to be there. Patient agreed.

## 2019-10-15 NOTE — Telephone Encounter (Signed)
Pt says she was recently prescribed Bactrim and since then she has had blood in her urine. Pt says she has also been running a fever and has a headache and chills. Is going to Urgent Care to be tested for COVID. Wants advice on what to do about blood being in urine.

## 2019-10-17 ENCOUNTER — Telehealth: Payer: Self-pay | Admitting: Family Medicine

## 2019-10-17 MED ORDER — CIPROFLOXACIN HCL 500 MG PO TABS: 500.0000 mg | ORAL_TABLET | Freq: Two times a day (BID) | ORAL | 0 refills | Status: DC

## 2019-10-17 NOTE — Telephone Encounter (Signed)
Patient had a visit with Dr. Warrick Parisian 09/26/19 for UTI and was given Keflex.  Patient states she finished abx and a few days after finishing abx she started to have hematuria and thought she had kidney stones. Bactrim was sent in but patient states she could not take abx.  Patient went to urgent care on 10/15/19 and was given macrobid.  Patient states she still has a few days left of it but the abx is making her so sick.  She has not been able to eat and has been having nausea.  Would like to know if something else can be sent in?  Patient was having chills and fever but they have went away.  C/O headaches with hematuria.  Please advise

## 2019-10-17 NOTE — Telephone Encounter (Signed)
  Incoming Patient Call  10/17/2019  What symptoms do you have? Nauseous, headache, blood in urine  How long have you been sick? Has been having UTI since 09/26/2019  Have you been seen for this problem? yes  If your provider decides to give you a prescription, which pharmacy would you like for it to be sent to? [facility]    Patient informed that this information will be sent to the clinical staff for review and that they should receive a follow up call.

## 2019-10-17 NOTE — Telephone Encounter (Signed)
Patient aware and verbalized understanding. °

## 2019-10-17 NOTE — Telephone Encounter (Signed)
I sent Cipro for the patient but the challenges any antibiotic can cause upset stomach because it disrupts the normal bacterial flora, recommend to take a probiotic or Greek yogurt while she is taking the antibiotic.

## 2019-11-14 ENCOUNTER — Telehealth: Payer: Self-pay | Admitting: Family Medicine

## 2019-11-14 ENCOUNTER — Other Ambulatory Visit: Payer: Medicare Other

## 2019-11-14 ENCOUNTER — Other Ambulatory Visit: Payer: Self-pay

## 2019-11-14 DIAGNOSIS — R399 Unspecified symptoms and signs involving the genitourinary system: Secondary | ICD-10-CM

## 2019-11-14 NOTE — Telephone Encounter (Signed)
Pt wants to come to do a repeat urine to make sure the infection has cleared up. She was on cipro and it helped her. Can she come by to drop off urine? She is going on vacation tomorrow. Please call back

## 2019-11-14 NOTE — Telephone Encounter (Signed)
Pt will come by today to leave sample for urine culture. She is aware that we may not call until next Monday. If ATB is needed [facility] while [facility]

## 2019-11-16 LAB — URINE CULTURE

## 2019-12-04 ENCOUNTER — Ambulatory Visit (INDEPENDENT_AMBULATORY_CARE_PROVIDER_SITE_OTHER): Payer: Medicare Other | Admitting: Family Medicine

## 2019-12-04 ENCOUNTER — Encounter: Payer: Self-pay | Admitting: Family Medicine

## 2019-12-04 DIAGNOSIS — N644 Mastodynia: Secondary | ICD-10-CM

## 2019-12-04 NOTE — Progress Notes (Signed)
Virtual Visit via Telephone Note  I connected with [name] on 12/07/19 at 9:09 AM by telephone and verified that I am speaking with the correct person using two identifiers. Edgar Corrigan is currently located at home and her boyfriend is currently with her during this visit. The provider, Loman Brooklyn, FNP is located in their office at time of visit.  I discussed the limitations, risks, security and privacy concerns of performing an evaluation and management service by telephone and the availability of in person appointments. I also discussed with the patient that there may be a patient responsible charge related to this service. The patient expressed understanding and agreed to proceed.  Subjective: PCP: Dettinger, Fransisca Kaufmann, MD  Chief Complaint  Patient presents with  . Pain   Patient reports 2 weeks ago she started having a burning pain in her left breast and under her left arm.  A few days later she experienced similar pain in the right breast.  She reports the pain is worse when she takes her bra off and gravity pulls down the heaviness of her breast.  There is and has never been any rash present.  The pain improved but then occurred again yesterday on the left to a lesser extent.  Patient does not have any history of breast surgeries.  Denies any swelling, masses, or erythema.  Patient feels this is not the same pain you get if you drink too much caffeine, as she reports she experienced this when she was younger.  Her last mammogram on 02/12/2019 only showed scattered areas of fibroglandular density; this was performed on the [place] bus that comes to our office.  Patient feels that she has shingles.   ROS: Per HPI  Current Outpatient Medications:  .  aspirin (BAYER ASPIRIN) 325 MG tablet, Take 325 mg by mouth daily., Disp: , Rfl:  .  atorvastatin (LIPITOR) 40 MG tablet, Take 1 tablet (40 mg total) by mouth daily at 2 PM., Disp: 90 tablet, Rfl: 3 .  Calcium Carbonate-Vitamin  D (CALCIUM 600+D PO), Take by mouth., Disp: , Rfl:  .  cephALEXin (KEFLEX) 500 MG capsule, Take 1 capsule (500 mg total) by mouth 4 (four) times daily., Disp: 28 capsule, Rfl: 0 .  ciprofloxacin (CIPRO) 500 MG tablet, Take 1 tablet (500 mg total) by mouth 2 (two) times daily., Disp: 20 tablet, Rfl: 0 .  docusate sodium (COLACE) 100 MG capsule, Take 100 mg by mouth daily., Disp: , Rfl:  .  esomeprazole (NEXIUM) 20 MG capsule, Take 20 mg by mouth daily at 12 noon., Disp: , Rfl:  .  FLUoxetine (PROZAC) 20 MG capsule, Take 1 capsule (20 mg total) by mouth daily., Disp: 90 capsule, Rfl: 3 .  levothyroxine (SYNTHROID) 75 MCG tablet, Take 1 tablet (75 mcg total) by mouth daily., Disp: 90 tablet, Rfl: 3 .  montelukast (SINGULAIR) 10 MG tablet, Take 1 tablet (10 mg total) by mouth at bedtime., Disp: 90 tablet, Rfl: 3 .  OVER THE COUNTER MEDICATION, Preser Vision, Disp: , Rfl:  .  Polyethylene Glycol 3350 (MIRALAX PO), Take by mouth., Disp: , Rfl:  .  sulfamethoxazole-trimethoprim (BACTRIM DS) 800-160 MG tablet, Take 1 tablet by mouth 2 (two) times daily., Disp: 14 tablet, Rfl: 0  Allergies  Allergen Reactions  . Nsaids Other (See Comments)    GI bleeding and Barrett's Esophagus  . Phenergan [Promethazine Hcl] Other (See Comments)    tongue swelling    Past Medical History:  Diagnosis Date  .  Allergy   . Anxiety   . Arthritis   . Cataract   . Clotting disorder ([place])   . GERD (gastroesophageal reflux disease)   . Heart murmur   . Hyperlipidemia   . Mitral valve prolapse   . Osteoporosis   . Thyroid disease     Observations/Objective: A&O  No respiratory distress or wheezing audible over the phone Mood, judgement, and thought processes all WNL  Assessment and Plan: 1. Pain of both breasts - Discussed that this did not fit shingles. - MM DIAG BREAST TOMO BILATERAL; Future - US BREAST LTD UNI LEFT INC AXILLA; Future - US BREAST LTD UNI RIGHT INC AXILLA; Future    Follow Up  Instructions:  I discussed the assessment and treatment plan with the patient. The patient was provided an opportunity to ask questions and all were answered. The patient agreed with the plan and demonstrated an understanding of the instructions.   The patient was advised to call back or seek an in-person evaluation if the symptoms worsen or if the condition fails to improve as anticipated.  The above assessment and management plan was discussed with the patient. The patient verbalized understanding of and has agreed to the management plan. Patient is aware to call the clinic if symptoms persist or worsen. Patient is aware when to return to the clinic for a follow-up visit. Patient educated on when it is appropriate to go to the emergency department.   Time call ended: 9:22 PM  I provided 15 minutes of non-face-to-face time during this encounter.  Hendricks Limes, MSN, APRN, FNP-C [facility] 12/07/19

## 2019-12-07 ENCOUNTER — Encounter: Payer: Self-pay | Admitting: Family Medicine

## 2019-12-09 ENCOUNTER — Inpatient Hospital Stay
Admission: RE | Admit: 2019-12-09 | Discharge: 2019-12-09 | Disposition: A | Discharge status: Home / Self Care | Payer: Self-pay | Source: Ambulatory Visit | Attending: Family Medicine | Admitting: Family Medicine

## 2019-12-09 ENCOUNTER — Other Ambulatory Visit (HOSPITAL_COMMUNITY): Payer: Self-pay | Admitting: Family Medicine

## 2019-12-09 ENCOUNTER — Other Ambulatory Visit: Payer: Self-pay | Admitting: Family Medicine

## 2019-12-09 DIAGNOSIS — N644 Mastodynia: Secondary | ICD-10-CM

## 2019-12-12 NOTE — Progress Notes (Signed)
Subjective: 1. Microscopic hematuria   2. Personal history of urinary infection      Teresa Frederick is a 74 yo female who is sent in consultation by Dr. Warrick Parisian for hematuria.  She saw blood in the urine and suprapubic pain with some frequency and urgency and she had another episode but can't recall whether it was before or after.  She had e. Coli on culture on 5/27.  She was given cipro.  She had a prior UTI in 2019.  She thinks she may have had stones in the past when she had suprapubic pain with hematuria that resolved spontaneously.   She has had a hysterectomy with a bladder tack in 3/19.   She moved from [place] 2 years ago.   She has some intermittent frequency and urgency intermittent.   She has mild SUI but it is worse with a UTI.  She has no smoking history.   ROS:  ROS  Allergies  Allergen Reactions  . Promethazine Anaphylaxis and Swelling    tongue swelling  Tongue swells Tongue swells tongue swelling   . Nsaids Other (See Comments)    GI bleeding and Barrett's Esophagus  . Phenergan [Promethazine Hcl] Other (See Comments)    tongue swelling   . Sulfa Antibiotics     Past Medical History:  Diagnosis Date  . Allergy   . Anxiety   . Arthritis   . Cataract   . Clotting disorder ([place])   . Factor V Leiden mutation ([place])   . GERD (gastroesophageal reflux disease)   . Heart murmur   . Hyperlipidemia   . Kidney stone   . Mitral valve prolapse   . Osteoporosis   . Thyroid disease     Past Surgical History:  Procedure Laterality Date  . ABDOMINAL HYSTERECTOMY  07/25/2017  . COSMETIC SURGERY  2018   brow/eye lift  . CYSTOCELE REPAIR      Social History   Socioeconomic History  . Marital status: Widowed    Spouse name: Not on file  . Number of children: 2  . Years of education: 53  . Highest education level: High school graduate  Occupational History  . Occupation: retired    Comment: Social research officer, government  Tobacco Use  . Smoking status: Never Smoker  .  Smokeless tobacco: Never Used  Vaping Use  . Vaping Use: Never used  Substance and Sexual Activity  . Alcohol use: Yes    Alcohol/week: 5.0 standard drinks    Types: 5 Shots of liquor per week  . Drug use: Never  . Sexual activity: Yes    Birth control/protection: Surgical  Other Topics Concern  . Not on file  Social History Narrative   Been with current boyfriend since 2016   Social Determinants of Health   Financial Resource Strain:   . Difficulty of Paying Living Expenses:   Food Insecurity:   . Worried About Charity fundraiser in the Last Year:   . Arboriculturist in the Last Year:   Transportation Needs:   . Film/video editor (Medical):   Marland Kitchen Lack of Transportation (Non-Medical):   Physical Activity:   . Days of Exercise per Week:   . Minutes of Exercise per Session:   Stress:   . Feeling of Stress :   Social Connections: Unknown  . Frequency of Communication with Friends and Family: Not on file  . Frequency of Social Gatherings with Friends and Family: Not on file  . Attends Religious  Services: Never  . Active Member of Clubs or Organizations: No  . Attends Archivist Meetings: Never  . Marital Status: Living with partner  Intimate Partner Violence: Not At Risk  . Fear of Current or Ex-Partner: No  . Emotionally Abused: No  . Physically Abused: No  . Sexually Abused: No    Family History  Problem Relation Age of Onset  . COPD Mother   . Diabetes Mother   . Hearing loss Mother   . Hypertension Mother   . Stroke Mother 74  . Early death Father   . Heart disease Father   . Heart attack Father 56  . Obesity Sister   . Aneurysm Sister   . COPD Brother   . Cancer Grandchild        metastatic cancer from melanoma in her eye    Anti-infectives: Anti-infectives (From admission, onward)   None      Current Outpatient Medications  Medication Sig Dispense Refill  . aspirin (BAYER ASPIRIN) 325 MG tablet Take 325 mg by mouth daily.    Marland Kitchen  atorvastatin (LIPITOR) 40 MG tablet Take 1 tablet (40 mg total) by mouth daily at 2 PM. 90 tablet 3  . Calcium Carbonate-Vitamin D (CALCIUM 600+D PO) Take by mouth.    . esomeprazole (NEXIUM) 20 MG capsule Take 20 mg by mouth daily at 12 noon.    Marland Kitchen FLUoxetine (PROZAC) 20 MG capsule Take 1 capsule (20 mg total) by mouth daily. 90 capsule 3  . levothyroxine (SYNTHROID) 75 MCG tablet Take 1 tablet (75 mcg total) by mouth daily. 90 tablet 3  . montelukast (SINGULAIR) 10 MG tablet Take 10 mg by mouth at bedtime.    . Multiple Vitamins-Minerals (PRESERVISION AREDS PO) Take by mouth.    Marland Kitchen OVER THE COUNTER MEDICATION Preser Vision    . polyethylene glycol (MIRALAX / GLYCOLAX) 17 g packet Take 17 g by mouth daily.    Marland Kitchen docusate sodium (COLACE) 100 MG capsule Take 100 mg by mouth daily. (Patient not taking: Reported on 12/13/2019)    . montelukast (SINGULAIR) 10 MG tablet Take 1 tablet (10 mg total) by mouth at bedtime. (Patient not taking: Reported on 12/13/2019) 90 tablet 3  . Polyethylene Glycol 3350 (MIRALAX PO) Take by mouth. (Patient not taking: Reported on 12/13/2019)     No current facility-administered medications for this visit.     Objective: Vital signs in last 24 hours: BP 123/80   Pulse 58   Ht 5' 4.5" (1.638 m)   Wt 140 lb (63.5 kg)   BMI 23.66 kg/m   Intake/Output from previous day: No intake/output data recorded. Intake/Output this shift: @IOTHISSHIFT @   Physical Exam Vitals reviewed.  Constitutional:      Appearance: Normal appearance.  Cardiovascular:     Rate and Rhythm: Normal rate and regular rhythm.  Pulmonary:     Effort: Pulmonary effort is normal. No respiratory distress.     Breath sounds: Normal breath sounds.  Abdominal:     General: Abdomen is flat.     Palpations: Abdomen is soft. There is no mass.     Tenderness: There is no abdominal tenderness.  Musculoskeletal:        General: No swelling or tenderness. Normal range of motion.  Skin:    General:  Skin is warm and dry.  Neurological:     General: No focal deficit present.     Mental Status: She is alert and oriented to person, place, and time.  Psychiatric:        Mood and Affect: Mood normal.        Behavior: Behavior normal.     Lab Results:  Results for orders placed or performed in visit on 12/13/19 (from the past 24 hour(s))  Urinalysis, Routine w reflex microscopic     Status: None   Collection Time: 12/13/19  1:59 PM  Result Value Ref Range   Specific Gravity, UA 1.015 1.005 - 1.030   pH, UA 5.5 5.0 - 7.5   Color, UA Yellow Yellow   Appearance Ur Clear Clear   Leukocytes,UA Negative Negative   Protein,UA Negative Negative/Trace   Glucose, UA Negative Negative   Ketones, UA Negative Negative   RBC, UA Negative Negative   Bilirubin, UA Negative Negative   Urobilinogen, Ur 0.2 0.2 - 1.0 mg/dL   Nitrite, UA Negative Negative   Microscopic Examination Comment    Narrative   Performed at:  [facility] [address] [place], [place]  [id number] Lab Director: Mina Marble MT, Phone:  [phone]    BMET No results for input(s): NA, K, CL, CO2, GLUCOSE, BUN, CREATININE, CALCIUM in the last 72 hours. PT/INR No results for input(s): LABPROT, INR in the last 72 hours. ABG No results for input(s): PHART, HCO3 in the last 72 hours.  Invalid input(s): PCO2, PO2  Studies/Results: No results found.   Assessment/Plan: Microscopic hematuria She has had episodes of gross hematuria in the past and I can't say for certain that they were from stones or UTI's.  I will have her return with a CT hematuria study for cystoscopy.  Personal history of urinary infection She has had UTI's in the past with e. Coli in May.   Her UA is clear today.    No orders of the defined types were placed in this encounter.    Orders Placed This Encounter  Procedures  . CT HEMATURIA WORKUP    Standing Status:   Future    Standing Expiration Date:   01/13/2020    Order  Specific Question:   Reason for Exam (SYMPTOM  OR DIAGNOSIS REQUIRED)    Answer:   microhematuria    Order Specific Question:   Preferred imaging location?    Answer:   [facility]    Order Specific Question:   Radiology Contrast Protocol - do NOT remove file path    Answer:   [email or web address]  . Urinalysis, Routine w reflex microscopic     Return for Next available with CT results for cystoscopy. .    CC: Dr. Vonna Kotyk Dettinger.      Irine Seal 12/13/2019 [phone]

## 2019-12-13 ENCOUNTER — Ambulatory Visit (INDEPENDENT_AMBULATORY_CARE_PROVIDER_SITE_OTHER): Payer: Medicare Other | Admitting: Urology

## 2019-12-13 ENCOUNTER — Encounter: Payer: Self-pay | Admitting: Urology

## 2019-12-13 ENCOUNTER — Other Ambulatory Visit: Payer: Self-pay

## 2019-12-13 VITALS — BP 123/80 | HR 58 | Ht 64.5 in | Wt 140.0 lb

## 2019-12-13 DIAGNOSIS — R3129 Other microscopic hematuria: Secondary | ICD-10-CM | POA: Insufficient documentation

## 2019-12-13 DIAGNOSIS — Z87440 Personal history of urinary (tract) infections: Secondary | ICD-10-CM

## 2019-12-13 LAB — URINALYSIS, ROUTINE W REFLEX MICROSCOPIC
Bilirubin, UA: NEGATIVE
Glucose, UA: NEGATIVE
Ketones, UA: NEGATIVE
Leukocytes,UA: NEGATIVE
Nitrite, UA: NEGATIVE
Protein,UA: NEGATIVE
RBC, UA: NEGATIVE
Specific Gravity, UA: 1.015 (ref 1.005–1.030)
Urobilinogen, Ur: 0.2 mg/dL (ref 0.2–1.0)
pH, UA: 5.5 (ref 5.0–7.5)

## 2019-12-13 NOTE — Assessment & Plan Note (Signed)
She has had episodes of gross hematuria in the past and I can't say for certain that they were from stones or UTI's.  I will have her return with a CT hematuria study for cystoscopy.

## 2019-12-13 NOTE — Progress Notes (Signed)
Urological Symptom Review  Patient is experiencing the following symptoms: Frequent urination Get up at night to urinate Leakage of urine Kidney stone  Review of Systems  Gastrointestinal (upper)  : Indigestion/heartburn  Gastrointestinal (lower) : Negative for lower GI symptoms  Constitutional : Negative for symptoms  Skin: Negative for skin symptoms  Eyes: Negative for eye symptoms  Ear/Nose/Throat : Negative for Ear/Nose/Throat symptoms  Hematologic/Lymphatic: Negative for Hematologic/Lymphatic symptoms  Cardiovascular : Negative for cardiovascular symptoms  Respiratory : Negative for respiratory symptoms  Endocrine: Negative for endocrine symptoms  Musculoskeletal: Negative for musculoskeletal symptoms  Neurological: Negative for neurological symptoms  Psychologic: Negative for psychiatric symptoms

## 2019-12-13 NOTE — Assessment & Plan Note (Signed)
She has had UTI's in the past with e. Coli in May.   Her UA is clear today.

## 2019-12-17 ENCOUNTER — Ambulatory Visit (HOSPITAL_COMMUNITY)
Admission: RE | Admit: 2019-12-17 | Discharge: 2019-12-17 | Disposition: A | Discharge status: Home / Self Care | Payer: Medicare Other | Source: Ambulatory Visit | Attending: Family Medicine | Admitting: Family Medicine

## 2019-12-17 ENCOUNTER — Other Ambulatory Visit: Payer: Self-pay

## 2019-12-17 DIAGNOSIS — N644 Mastodynia: Secondary | ICD-10-CM

## 2019-12-17 DIAGNOSIS — R922 Inconclusive mammogram: Secondary | ICD-10-CM | POA: Diagnosis not present

## 2020-01-06 ENCOUNTER — Other Ambulatory Visit: Payer: Self-pay

## 2020-01-07 ENCOUNTER — Ambulatory Visit (HOSPITAL_COMMUNITY): Payer: Medicare Other

## 2020-01-08 ENCOUNTER — Other Ambulatory Visit: Payer: Self-pay

## 2020-01-08 DIAGNOSIS — R3129 Other microscopic hematuria: Secondary | ICD-10-CM

## 2020-01-08 NOTE — Addendum Note (Signed)
Addended by: Dorisann Frames on: 01/08/2020 09:55 AM   Modules accepted: Orders

## 2020-01-17 ENCOUNTER — Ambulatory Visit: Payer: Medicare Other | Admitting: Urology

## 2020-01-24 ENCOUNTER — Ambulatory Visit (INDEPENDENT_AMBULATORY_CARE_PROVIDER_SITE_OTHER): Payer: Medicare Other | Admitting: Family Medicine

## 2020-01-24 ENCOUNTER — Encounter: Payer: Self-pay | Admitting: Family Medicine

## 2020-01-24 ENCOUNTER — Other Ambulatory Visit: Payer: Self-pay

## 2020-01-24 VITALS — BP 130/73 | HR 60 | Temp 97.5°F | Ht 64.5 in | Wt 142.0 lb

## 2020-01-24 DIAGNOSIS — F339 Major depressive disorder, recurrent, unspecified: Secondary | ICD-10-CM

## 2020-01-24 DIAGNOSIS — N644 Mastodynia: Secondary | ICD-10-CM

## 2020-01-24 DIAGNOSIS — F411 Generalized anxiety disorder: Secondary | ICD-10-CM

## 2020-01-24 DIAGNOSIS — M7631 Iliotibial band syndrome, right leg: Secondary | ICD-10-CM

## 2020-01-24 DIAGNOSIS — E039 Hypothyroidism, unspecified: Secondary | ICD-10-CM | POA: Diagnosis not present

## 2020-01-24 DIAGNOSIS — Z23 Encounter for immunization: Secondary | ICD-10-CM

## 2020-01-24 DIAGNOSIS — K219 Gastro-esophageal reflux disease without esophagitis: Secondary | ICD-10-CM

## 2020-01-24 DIAGNOSIS — E782 Mixed hyperlipidemia: Secondary | ICD-10-CM | POA: Diagnosis not present

## 2020-01-24 NOTE — Progress Notes (Signed)
BP 130/73   Pulse 60   Temp (!) 97.5 F (36.4 C)   Ht 5' 4.5" (1.638 m)   Wt 142 lb (64.4 kg)   SpO2 96%   BMI 24.00 kg/m    Subjective:   Patient ID: Teresa Frederick, female    DOB: 07-21-1945, 74 y.o.   MRN: [id number]  HPI: Teresa Frederick is a 74 y.o. female presenting on 01/24/2020 for Medical Management of Chronic Issues, Depression, Anxiety, and Hypothyroidism   HPI Hypothyroidism recheck Patient is coming in for thyroid recheck today as well. They deny any issues with hair changes or heat or cold problems or diarrhea or constipation. They deny any chest pain or palpitations. They are currently on levothyroxine 75mcrograms   Patient is coming in with bilateral burning and breast tenderness although it has improved, its been going on for a month, she did have a mammogram less than a month ago that was normal and did not see any abnormalities.  She denies any nipple discharge or irritation.  Patient does feel like she is slightly more engorged but it was especially bad a month ago when she was feeling like she could not even sleep without a bra because if they hung and they hurt.  Depression anxiety recheck. Patient says she is doing good on her Prozac and feels like her depression and anxiety is doing well.  Hyperlipidemia Patient is coming in for recheck of his hyperlipidemia. The patient is currently taking lipitor. They deny any issues with myalgias or history of liver damage from it. They deny any focal numbness or weakness or chest pain.   GERD Patient is currently on nexium.  She denies any major symptoms or abdominal pain or belching or burping. She denies any blood in her stool or lightheadedness or dizziness.   Patient complains of right hip burning sometimes when she is up and moving sometimes when she lays on that side.  Is been going on for couple weeks, its not severe but it has been bothering her off and on.  She wanted to get it checked out.  Relevant past  medical, surgical, family and social history reviewed and updated as indicated. Interim medical history since our last visit reviewed. Allergies and medications reviewed and updated.  Review of Systems  Constitutional: Negative for chills and fever.  Eyes: Negative for visual disturbance.  Respiratory: Negative for chest tightness and shortness of breath.   Cardiovascular: Negative for chest pain and leg swelling.  Musculoskeletal: Negative for back pain and gait problem.  Skin: Negative for rash.  Neurological: Negative for light-headedness and headaches.  Psychiatric/Behavioral: Negative for agitation and behavioral problems.  All other systems reviewed and are negative.   Per HPI unless specifically indicated above   Allergies as of 01/24/2020      Reactions   Promethazine Anaphylaxis, Swelling   tongue swelling  Tongue swells Tongue swells tongue swelling    Nsaids Other (See Comments)   GI bleeding and Barrett's Esophagus   Phenergan [promethazine Hcl] Other (See Comments)   tongue swelling    Sulfa Antibiotics       Medication List       Accurate as of January 24, 2020 11:34 AM. If you have any questions, ask your nurse or doctor.        atorvastatin 40 MG tablet Commonly known as: LIPITOR Take 1 tablet (40 mg total) by mouth daily at 2 PM.   Bayer Aspirin 325 MG tablet Generic drug: aspirin Take 325  mg by mouth daily.   CALCIUM 600+D PO Take by mouth.   docusate sodium 100 MG capsule Commonly known as: COLACE Take 100 mg by mouth daily.   esomeprazole 20 MG capsule Commonly known as: NEXIUM Take 20 mg by mouth daily at 12 noon.   FLUoxetine 20 MG capsule Commonly known as: PROZAC Take 1 capsule (20 mg total) by mouth daily.   levothyroxine 75 MCG tablet Commonly known as: SYNTHROID Take 1 tablet (75 mcg total) by mouth daily.   MIRALAX PO Take by mouth.   polyethylene glycol 17 g packet Commonly known as: MIRALAX / GLYCOLAX Take 17 g by  mouth daily.   montelukast 10 MG tablet Commonly known as: SINGULAIR Take 1 tablet (10 mg total) by mouth at bedtime. What changed: Another medication with the same name was removed. Continue taking this medication, and follow the directions you see here. Changed by: Worthy Rancher, MD   OVER THE COUNTER MEDICATION Preser Vision   PRESERVISION AREDS PO Take by mouth.   psyllium 58.6 % powder Commonly known as: METAMUCIL Take 1 packet by mouth daily.   SMARTY PANTS KIDS PROBIOTIC PO Take by mouth daily.        Objective:   BP 130/73   Pulse 60   Temp (!) 97.5 F (36.4 C)   Ht 5' 4.5" (1.638 m)   Wt 142 lb (64.4 kg)   SpO2 96%   BMI 24.00 kg/m   Wt Readings from Last 3 Encounters:  01/24/20 142 lb (64.4 kg)  12/13/19 140 lb (63.5 kg)  09/26/19 146 lb 8 oz (66.5 kg)    Physical Exam Vitals and nursing note reviewed. Exam conducted with a chaperone present.  Constitutional:      General: She is not in acute distress.    Appearance: She is well-developed. She is not diaphoretic.  Eyes:     Conjunctiva/sclera: Conjunctivae normal.  Cardiovascular:     Rate and Rhythm: Normal rate and regular rhythm.     Heart sounds: Normal heart sounds. No murmur heard.   Pulmonary:     Effort: Pulmonary effort is normal. No respiratory distress.     Breath sounds: Normal breath sounds. No wheezing.  Chest:     Breasts: Breasts are symmetrical.        Right: Tenderness present. No swelling, inverted nipple, mass or nipple discharge.        Left: Tenderness present. No swelling, inverted nipple, mass or nipple discharge.  Musculoskeletal:        General: No tenderness. Normal range of motion.  Skin:    General: Skin is warm and dry.     Findings: No rash.  Neurological:     Mental Status: She is alert and oriented to person, place, and time.     Coordination: Coordination normal.  Psychiatric:        Behavior: Behavior normal.       Assessment & Plan:    Problem List Items Addressed This Visit      Digestive   GERD (gastroesophageal reflux disease)   Relevant Medications   psyllium (METAMUCIL) 58.6 % powder   Probiotic Product (SMARTY PANTS KIDS PROBIOTIC PO)   Other Relevant Orders   CBC with Differential/Platelet     Endocrine   Hypothyroidism - Primary   Relevant Orders   CMP14+EGFR   TSH     Other   Hyperlipemia   Relevant Orders   CMP14+EGFR   Lipid panel   Depression, recurrent (  [place])   GAD (generalized anxiety disorder)    Other Visit Diagnoses    It band syndrome, right       Breast tenderness in female       Need for pneumococcal vaccine       Relevant Orders   Pneumococcal conjugate vaccine 13-valent (Completed)      Recommended stretching and movement exercises strengthening for the IT band.  Patient has normal mammogram with bilateral breast pain, the breast pain is improving, the mammogram showed normal results. Follow up plan: Return if symptoms worsen or fail to improve, for Depression anxiety and thyroid recheck..  Counseling provided for all of the vaccine components Orders Placed This Encounter  Procedures  . Pneumococcal conjugate vaccine 13-valent  . CBC with Differential/Platelet  . CMP14+EGFR  . Lipid panel  . TSH    Caryl Pina, MD [facility] 01/24/2020, 11:34 AM

## 2020-01-25 LAB — CBC WITH DIFFERENTIAL/PLATELET
Basophils Absolute: 0.1 10*3/uL (ref 0.0–0.2)
Basos: 1 %
EOS (ABSOLUTE): 0.1 10*3/uL (ref 0.0–0.4)
Eos: 1 %
Hematocrit: 42.2 % (ref 34.0–46.6)
Hemoglobin: 14.1 g/dL (ref 11.1–15.9)
Immature Grans (Abs): 0 10*3/uL (ref 0.0–0.1)
Immature Granulocytes: 0 %
Lymphocytes Absolute: 2.3 10*3/uL (ref 0.7–3.1)
Lymphs: 36 %
MCH: 32.6 pg (ref 26.6–33.0)
MCHC: 33.4 g/dL (ref 31.5–35.7)
MCV: 98 fL — ABNORMAL HIGH (ref 79–97)
Monocytes Absolute: 0.7 10*3/uL (ref 0.1–0.9)
Monocytes: 11 %
Neutrophils Absolute: 3.3 10*3/uL (ref 1.4–7.0)
Neutrophils: 51 %
Platelets: 329 10*3/uL (ref 150–450)
RBC: 4.32 x10E6/uL (ref 3.77–5.28)
RDW: 12.4 % (ref 11.7–15.4)
WBC: 6.5 10*3/uL (ref 3.4–10.8)

## 2020-01-25 LAB — CMP14+EGFR
ALT: 25 IU/L (ref 0–32)
AST: 31 IU/L (ref 0–40)
Albumin/Globulin Ratio: 1.9 (ref 1.2–2.2)
Albumin: 4.5 g/dL (ref 3.7–4.7)
Alkaline Phosphatase: 85 IU/L (ref 48–121)
BUN/Creatinine Ratio: 32 — ABNORMAL HIGH (ref 12–28)
BUN: 23 mg/dL (ref 8–27)
Bilirubin Total: 0.2 mg/dL (ref 0.0–1.2)
CO2: 27 mmol/L (ref 20–29)
Calcium: 9.4 mg/dL (ref 8.7–10.3)
Chloride: 103 mmol/L (ref 96–106)
Creatinine, Ser: 0.72 mg/dL (ref 0.57–1.00)
GFR calc Af Amer: 97 mL/min/{1.73_m2} (ref >59)
GFR calc non Af Amer: 84 mL/min/{1.73_m2} (ref >59)
Globulin, Total: 2.4 g/dL (ref 1.5–4.5)
Glucose: 79 mg/dL (ref 65–99)
Potassium: 4.8 mmol/L (ref 3.5–5.2)
Sodium: 143 mmol/L (ref 134–144)
Total Protein: 6.9 g/dL (ref 6.0–8.5)

## 2020-01-25 LAB — LIPID PANEL
Chol/HDL Ratio: 3 ratio (ref 0.0–4.4)
Cholesterol, Total: 188 mg/dL (ref 100–199)
HDL: 63 mg/dL (ref >39)
LDL Chol Calc (NIH): 107 mg/dL — ABNORMAL HIGH (ref 0–99)
Triglycerides: 100 mg/dL (ref 0–149)
VLDL Cholesterol Cal: 18 mg/dL (ref 5–40)

## 2020-01-25 LAB — TSH: TSH: 0.781 u[IU]/mL (ref 0.450–4.500)

## 2020-01-29 ENCOUNTER — Telehealth: Payer: Self-pay | Admitting: Family Medicine

## 2020-01-29 NOTE — Telephone Encounter (Signed)
Patient notified of lab results and verbalized understanding.  ° °

## 2020-01-30 ENCOUNTER — Ambulatory Visit (HOSPITAL_COMMUNITY)
Admission: RE | Admit: 2020-01-30 | Discharge: 2020-01-30 | Disposition: A | Discharge status: Home / Self Care | Payer: Medicare Other | Source: Ambulatory Visit | Attending: Urology | Admitting: Urology

## 2020-01-30 ENCOUNTER — Other Ambulatory Visit: Payer: Self-pay

## 2020-01-30 DIAGNOSIS — K5730 Diverticulosis of large intestine without perforation or abscess without bleeding: Secondary | ICD-10-CM | POA: Diagnosis not present

## 2020-01-30 DIAGNOSIS — R3129 Other microscopic hematuria: Secondary | ICD-10-CM | POA: Diagnosis not present

## 2020-01-30 DIAGNOSIS — M4316 Spondylolisthesis, lumbar region: Secondary | ICD-10-CM | POA: Diagnosis not present

## 2020-01-30 MED ORDER — IOHEXOL 300 MG/ML  SOLN: 150.0000 mL | Freq: Once | INTRAMUSCULAR | Status: DC | PRN

## 2020-01-31 ENCOUNTER — Other Ambulatory Visit: Payer: Self-pay

## 2020-01-31 ENCOUNTER — Telehealth: Payer: Self-pay

## 2020-01-31 NOTE — Telephone Encounter (Signed)
Pt. Notified of ct results.

## 2020-01-31 NOTE — Telephone Encounter (Signed)
-----   Message from Irine Seal, MD sent at 01/31/2020 12:33 PM EDT ----- No cause for the hematuria is noted.  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  She should f/u as planned.

## 2020-02-14 ENCOUNTER — Ambulatory Visit (INDEPENDENT_AMBULATORY_CARE_PROVIDER_SITE_OTHER): Payer: Medicare Other | Admitting: Urology

## 2020-02-14 ENCOUNTER — Encounter: Payer: Self-pay | Admitting: Urology

## 2020-02-14 ENCOUNTER — Other Ambulatory Visit: Payer: Self-pay

## 2020-02-14 VITALS — BP 121/78 | HR 62 | Temp 98.4°F | Ht 64.5 in | Wt 142.0 lb

## 2020-02-14 DIAGNOSIS — Z87440 Personal history of urinary (tract) infections: Secondary | ICD-10-CM

## 2020-02-14 DIAGNOSIS — N3941 Urge incontinence: Secondary | ICD-10-CM | POA: Diagnosis not present

## 2020-02-14 LAB — URINALYSIS, ROUTINE W REFLEX MICROSCOPIC
Bilirubin, UA: NEGATIVE
Glucose, UA: NEGATIVE
Ketones, UA: NEGATIVE
Leukocytes,UA: NEGATIVE
Nitrite, UA: NEGATIVE
Protein,UA: NEGATIVE
RBC, UA: NEGATIVE
Specific Gravity, UA: 1.015 (ref 1.005–1.030)
Urobilinogen, Ur: 0.2 mg/dL (ref 0.2–1.0)
pH, UA: 6 (ref 5.0–7.5)

## 2020-02-14 MED ORDER — ESTRADIOL 0.1 MG/GM VA CREA: TOPICAL_CREAM | VAGINAL | 12 refills | Status: DC

## 2020-02-14 MED ORDER — CIPROFLOXACIN HCL 500 MG PO TABS: 500.0000 mg | ORAL_TABLET | Freq: Once | ORAL | Status: DC

## 2020-02-14 NOTE — Progress Notes (Signed)
Urological Symptom Review  Patient is experiencing the following symptoms: Frequent urination Hard to postpone urination Leakage of urine Trouble starting stream Have to strain to urinate Painful intercourse Weak stream   Review of Systems  Gastrointestinal (upper)  : Indigestion/heartburn  Gastrointestinal (lower) : Negative for lower GI symptoms  Constitutional : Negative for symptoms  Skin: Negative for skin symptoms  Eyes: Negative for eye symptoms  Ear/Nose/Throat : Negative for Ear/Nose/Throat symptoms  Hematologic/Lymphatic: Negative for Hematologic/Lymphatic symptoms  Cardiovascular : Negative for cardiovascular symptoms  Respiratory : Negative for respiratory symptoms  Endocrine: Negative for endocrine symptoms  Musculoskeletal: Back pain Joint pain  Neurological: Negative for neurological symptoms  Psychologic: Depression

## 2020-02-14 NOTE — Progress Notes (Signed)
Subjective: 1. Personal history of urinary infection   2. Urge incontinence     Teresa Frederick returns today in f/u with the CT results from her hematuria w/u.  She has had no further hematuria.  She has some AM hesitancy and then with have some UUI doing the dishes.   She has dysparunia and has some issues with vaginal bleeding after sex.   CT IMPRESSION: 1. A cause for the patient's hematuria is not identified. 2. Other imaging findings of potential clinical significance: Descending and sigmoid colon diverticulosis. 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 3. Aortic atherosclerosis.  GU Hx: Teresa Frederick is a 74 yo female who is sent in consultation by Dr. Warrick Parisian for hematuria.  She saw blood in the urine and suprapubic pain with some frequency and urgency and she had another episode but can't recall whether it was before or after.  She had e. Coli on culture on 5/27.  She was given cipro.  She had a prior UTI in 2019.  She thinks she may have had stones in the past when she had suprapubic pain with hematuria that resolved spontaneously.   She has had a hysterectomy with a bladder tack in 3/19.   She moved from [place] 2 years ago.   She has some intermittent frequency and urgency intermittent.   She has mild SUI but it is worse with a UTI.  She has no smoking history.   ROS:  ROS  Allergies  Allergen Reactions  . Promethazine Anaphylaxis and Swelling    tongue swelling  Tongue swells Tongue swells tongue swelling   . Nsaids Other (See Comments)    GI bleeding and Barrett's Esophagus  . Phenergan [Promethazine Hcl] Other (See Comments)    tongue swelling   . Sulfa Antibiotics     Past Medical History:  Diagnosis Date  . Allergy   . Anxiety   . Arthritis   . Cataract   . Clotting disorder ([place])   . Factor V Leiden mutation ([place])   . GERD  (gastroesophageal reflux disease)   . Heart murmur   . Hyperlipidemia   . Kidney stone   . Mitral valve prolapse   . Osteoporosis   . Thyroid disease     Past Surgical History:  Procedure Laterality Date  . ABDOMINAL HYSTERECTOMY  07/25/2017  . COSMETIC SURGERY  2018   brow/eye lift  . CYSTOCELE REPAIR      Social History   Socioeconomic History  . Marital status: Widowed    Spouse name: Not on file  . Number of children: 2  . Years of education: 34  . Highest education level: High school graduate  Occupational History  . Occupation: retired    Comment: Social research officer, government  Tobacco Use  . Smoking status: Never Smoker  . Smokeless tobacco: Never Used  Vaping Use  . Vaping Use: Never used  Substance and Sexual Activity  . Alcohol use: Yes    Alcohol/week: 5.0 standard drinks    Types: 5 Shots of liquor per week  . Drug use: Never  . Sexual activity: Yes    Birth control/protection: Surgical  Other Topics Concern  . Not on file  Social History Narrative   Been with current boyfriend since 2016   Social Determinants of Health   Financial Resource Strain:   . Difficulty of Paying Living Expenses: Not on file  Food Insecurity:   .  Worried About Charity fundraiser in the Last Year: Not on file  . Ran Out of Food in the Last Year: Not on file  Transportation Needs:   . Lack of Transportation (Medical): Not on file  . Lack of Transportation (Non-Medical): Not on file  Physical Activity:   . Days of Exercise per Week: Not on file  . Minutes of Exercise per Session: Not on file  Stress:   . Feeling of Stress : Not on file  Social Connections: Unknown  . Frequency of Communication with Friends and Family: Not on file  . Frequency of Social Gatherings with Friends and Family: Not on file  . Attends Religious Services: Never  . Active Member of Clubs or Organizations: No  . Attends Archivist Meetings: Never  . Marital Status: Living with partner   Intimate Partner Violence: Not At Risk  . Fear of Current or Ex-Partner: No  . Emotionally Abused: No  . Physically Abused: No  . Sexually Abused: No    Family History  Problem Relation Age of Onset  . COPD Mother   . Diabetes Mother   . Hearing loss Mother   . Hypertension Mother   . Stroke Mother 79  . Early death Father   . Heart disease Father   . Heart attack Father 14  . Obesity Sister   . Aneurysm Sister   . COPD Brother   . Cancer Grandchild        metastatic cancer from melanoma in her eye    Anti-infectives: Anti-infectives (From admission, onward)   Start     Dose/Rate Route Frequency Ordered Stop   02/14/20 1345  CIPROFLOXACIN HCL 500 MG PO TABS        500 mg Oral  Once 02/14/20 1344        Current Outpatient Medications  Medication Sig Dispense Refill  . aspirin (BAYER ASPIRIN) 325 MG tablet Take 325 mg by mouth daily.    Marland Kitchen atorvastatin (LIPITOR) 40 MG tablet Take 1 tablet (40 mg total) by mouth daily at 2 PM. 90 tablet 3  . Calcium Carbonate-Vitamin D (CALCIUM 600+D PO) Take by mouth.    . docusate sodium (COLACE) 100 MG capsule Take 100 mg by mouth daily.     Marland Kitchen esomeprazole (NEXIUM) 20 MG capsule Take 20 mg by mouth daily at 12 noon.    Marland Kitchen FLUoxetine (PROZAC) 20 MG capsule Take 1 capsule (20 mg total) by mouth daily. 90 capsule 3  . levothyroxine (SYNTHROID) 75 MCG tablet Take 1 tablet (75 mcg total) by mouth daily. 90 tablet 3  . montelukast (SINGULAIR) 10 MG tablet Take 1 tablet (10 mg total) by mouth at bedtime. 90 tablet 3  . Multiple Vitamins-Minerals (PRESERVISION AREDS PO) Take by mouth.    Marland Kitchen OVER THE COUNTER MEDICATION Preser Vision    . polyethylene glycol (MIRALAX / GLYCOLAX) 17 g packet Take 17 g by mouth daily.    . Polyethylene Glycol 3350 (MIRALAX PO) Take by mouth.     . Probiotic Product (SMARTY PANTS KIDS PROBIOTIC PO) Take by mouth daily.    . psyllium (METAMUCIL) 58.6 % powder Take 1 packet by mouth daily.    Marland Kitchen estradiol (ESTRACE) 0.1  MG/GM vaginal cream Use 0.5gm vaginally at bedtime for 2 weeks and then 2-3x weekly. 42.5 g 12   Current Facility-Administered Medications  Medication Dose Route Frequency Provider Last Rate Last Admin  . ciprofloxacin (CIPRO) tablet 500 mg  500 mg Oral Once  Irine Seal, MD         Objective: Vital signs in last 24 hours: BP 121/78   Pulse 62   Temp 98.4 F (36.9 C)   Ht 5' 4.5" (1.638 m)   Wt 142 lb (64.4 kg)   BMI 24.00 kg/m   Intake/Output from previous day: No intake/output data recorded. Intake/Output this shift: @IOTHISSHIFT @   Physical Exam  Lab Results:  Results for orders placed or performed in visit on 02/14/20 (from the past 24 hour(s))  Urinalysis, Routine w reflex microscopic     Status: None   Collection Time: 02/14/20  1:27 PM  Result Value Ref Range   Specific Gravity, UA 1.015 1.005 - 1.030   pH, UA 6.0 5.0 - 7.5   Color, UA Yellow Yellow   Appearance Ur Clear Clear   Leukocytes,UA Negative Negative   Protein,UA Negative Negative/Trace   Glucose, UA Negative Negative   Ketones, UA Negative Negative   RBC, UA Negative Negative   Bilirubin, UA Negative Negative   Urobilinogen, Ur 0.2 0.2 - 1.0 mg/dL   Nitrite, UA Negative Negative   Microscopic Examination Comment    Narrative   Performed at:  [facility] [address] [place], [place]  [id number] Lab Director: [place], Phone:  [phone]    BMET No results for input(s): NA, K, CL, CO2, GLUCOSE, BUN, CREATININE, CALCIUM in the last 72 hours. PT/INR No results for input(s): LABPROT, INR in the last 72 hours. ABG No results for input(s): PHART, HCO3 in the last 72 hours.  Invalid input(s): PCO2, PO2  Studies/Results: Flexible cystoscopy was done after a betadine prep.  The urethra is normal without obstruction.  Bladder wall has mild trabeculation without mucosal lesions.  UO's are normal.   There were no complications.    Assessment/Plan: Hx of hemorrhagic  cystitis.  CT and Cysto are ok.     Atrophic vaginitis with dysparunia..  I will give her estrace cream.  Urgency with UUI.   I will see if the estrace cream helps.   Meds ordered this encounter  Medications  . ciprofloxacin (CIPRO) tablet 500 mg  . estradiol (ESTRACE) 0.1 MG/GM vaginal cream    Sig: Use 0.5gm vaginally at bedtime for 2 weeks and then 2-3x weekly.    Dispense:  42.5 g    Refill:  12     Orders Placed This Encounter  Procedures  . Urinalysis, Routine w reflex microscopic     Return in about 3 months (around 05/16/2020).    CC: Dr. Vonna Kotyk [place].      Irine Seal 02/14/2020 [phone]

## 2020-03-30 DIAGNOSIS — Z23 Encounter for immunization: Secondary | ICD-10-CM | POA: Diagnosis not present

## 2020-05-21 ENCOUNTER — Ambulatory Visit: Payer: Medicare Other | Admitting: Urology

## 2020-05-22 ENCOUNTER — Ambulatory Visit: Payer: Medicare Other | Admitting: Urology

## 2020-07-23 ENCOUNTER — Ambulatory Visit: Payer: Medicare Other | Admitting: Family Medicine

## 2020-08-14 ENCOUNTER — Other Ambulatory Visit: Payer: Self-pay

## 2020-08-17 ENCOUNTER — Ambulatory Visit (INDEPENDENT_AMBULATORY_CARE_PROVIDER_SITE_OTHER): Payer: Medicare Other

## 2020-08-17 VITALS — Ht 65.0 in | Wt 148.0 lb

## 2020-08-17 DIAGNOSIS — Z0000 Encounter for general adult medical examination without abnormal findings: Secondary | ICD-10-CM

## 2020-08-17 NOTE — Patient Instructions (Signed)
Ms. Tesar , Thank you for taking time to come for your Medicare Wellness Visit. I appreciate your ongoing commitment to your health goals. Please review the following plan we discussed and let me know if I can assist you in the future.   Screening recommendations/referrals: Colonoscopy: Done 01/18/2017 - Repeat 2023 Mammogram: Done 12/17/2019 - Repeat 1 year Bone Density: Done 07/15/2016 - Repeat every 2 years (You may be able to get this 4/8 at your office visit) Recommended yearly ophthalmology/optometry visit for glaucoma screening and checkup Recommended yearly dental visit for hygiene and checkup  Vaccinations: Influenza vaccine: Done 03/30/2020 Repeat annually Pneumococcal vaccine: Prevnar-13 done 01/24/20 - Get Pneumovax-23 one year later Tdap vaccine: Done 07/24/19 Repeat 10 years Shingles vaccine: Shingrix discussed. Please contact your pharmacy for coverage information.    Covid-19: 2 doses complete 08/09/19 & 08/21/19 - Due for booster  Conditions/risks identified: Continue to work on diet and exercise.  Next appointment: Follow up in one year for your annual wellness visit    Preventive Care 65 Years and Older, Female Preventive care refers to lifestyle choices and visits with your health care provider that can promote health and wellness. What does preventive care include?  A yearly physical exam. This is also called an annual well check.  Dental exams once or twice a year.  Routine eye exams. Ask your health care provider how often you should have your eyes checked.  Personal lifestyle choices, including:  Daily care of your teeth and gums.  Regular physical activity.  Eating a healthy diet.  Avoiding tobacco and drug use.  Limiting alcohol use.  Practicing safe sex.  Taking low-dose aspirin every day.  Taking vitamin and mineral supplements as recommended by your health care provider. What happens during an annual well check? The services and screenings done by  your health care provider during your annual well check will depend on your age, overall health, lifestyle risk factors, and family history of disease. Counseling  Your health care provider may ask you questions about your:  Alcohol use.  Tobacco use.  Drug use.  Emotional well-being.  Home and relationship well-being.  Sexual activity.  Eating habits.  History of falls.  Memory and ability to understand (cognition).  Work and work Statistician.  Reproductive health. Screening  You may have the following tests or measurements:  Height, weight, and BMI.  Blood pressure.  Lipid and cholesterol levels. These may be checked every 5 years, or more frequently if you are over 21 years old.  Skin check.  Lung cancer screening. You may have this screening every year starting at age 33 if you have a 30-pack-year history of smoking and currently smoke or have quit within the past 15 years.  Fecal occult blood test (FOBT) of the stool. You may have this test every year starting at age 77.  Flexible sigmoidoscopy or colonoscopy. You may have a sigmoidoscopy every 5 years or a colonoscopy every 10 years starting at age 21.  Hepatitis C blood test.  Hepatitis B blood test.  Sexually transmitted disease (STD) testing.  Diabetes screening. This is done by checking your blood sugar (glucose) after you have not eaten for a while (fasting). You may have this done every 1-3 years.  Bone density scan. This is done to screen for osteoporosis. You may have this done starting at age 38.  Mammogram. This may be done every 1-2 years. Talk to your health care provider about how often you should have regular mammograms. Talk  with your health care provider about your test results, treatment options, and if necessary, the need for more tests. Vaccines  Your health care provider may recommend certain vaccines, such as:  Influenza vaccine. This is recommended every year.  Tetanus,  diphtheria, and acellular pertussis (Tdap, Td) vaccine. You may need a Td booster every 10 years.  Zoster vaccine. You may need this after age 37.  Pneumococcal 13-valent conjugate (PCV13) vaccine. One dose is recommended after age 10.  Pneumococcal polysaccharide (PPSV23) vaccine. One dose is recommended after age 3. Talk to your health care provider about which screenings and vaccines you need and how often you need them. This information is not intended to replace advice given to you by your health care provider. Make sure you discuss any questions you have with your health care provider. Document Released: 05/29/2015 Document Revised: 01/20/2016 Document Reviewed: 03/03/2015 Elsevier Interactive Patient Education  2017 [place] Prevention in the Home Falls can cause injuries. They can happen to people of all ages. There are many things you can do to make your home safe and to help prevent falls. What can I do on the outside of my home?  Regularly fix the edges of walkways and driveways and fix any cracks.  Remove anything that might make you trip as you walk through a door, such as a raised step or threshold.  Trim any bushes or trees on the path to your home.  Use bright outdoor lighting.  Clear any walking paths of anything that might make someone trip, such as rocks or tools.  Regularly check to see if handrails are loose or broken. Make sure that both sides of any steps have handrails.  Any raised decks and porches should have guardrails on the edges.  Have any leaves, snow, or ice cleared regularly.  Use sand or salt on walking paths during winter.  Clean up any spills in your garage right away. This includes oil or grease spills. What can I do in the bathroom?  Use night lights.  Install grab bars by the toilet and in the tub and shower. Do not use towel bars as grab bars.  Use non-skid mats or decals in the tub or shower.  If you need to sit down in  the shower, use a plastic, non-slip stool.  Keep the floor dry. Clean up any water that spills on the floor as soon as it happens.  Remove soap buildup in the tub or shower regularly.  Attach bath mats securely with double-sided non-slip rug tape.  Do not have throw rugs and other things on the floor that can make you trip. What can I do in the bedroom?  Use night lights.  Make sure that you have a light by your bed that is easy to reach.  Do not use any sheets or blankets that are too big for your bed. They should not hang down onto the floor.  Have a firm chair that has side arms. You can use this for support while you get dressed.  Do not have throw rugs and other things on the floor that can make you trip. What can I do in the kitchen?  Clean up any spills right away.  Avoid walking on wet floors.  Keep items that you use a lot in easy-to-reach places.  If you need to reach something above you, use a strong step stool that has a grab bar.  Keep electrical cords out of the way.  Do  not use floor polish or wax that makes floors slippery. If you must use wax, use non-skid floor wax.  Do not have throw rugs and other things on the floor that can make you trip. What can I do with my stairs?  Do not leave any items on the stairs.  Make sure that there are handrails on both sides of the stairs and use them. Fix handrails that are broken or loose. Make sure that handrails are as long as the stairways.  Check any carpeting to make sure that it is firmly attached to the stairs. Fix any carpet that is loose or worn.  Avoid having throw rugs at the top or bottom of the stairs. If you do have throw rugs, attach them to the floor with carpet tape.  Make sure that you have a light switch at the top of the stairs and the bottom of the stairs. If you do not have them, ask someone to add them for you. What else can I do to help prevent falls?  Wear shoes that:  Do not have high  heels.  Have rubber bottoms.  Are comfortable and fit you well.  Are closed at the toe. Do not wear sandals.  If you use a stepladder:  Make sure that it is fully opened. Do not climb a closed stepladder.  Make sure that both sides of the stepladder are locked into place.  Ask someone to hold it for you, if possible.  Clearly mark and make sure that you can see:  Any grab bars or handrails.  First and last steps.  Where the edge of each step is.  Use tools that help you move around (mobility aids) if they are needed. These include:  Canes.  Walkers.  Scooters.  Crutches.  Turn on the lights when you go into a dark area. Replace any light bulbs as soon as they burn out.  Set up your furniture so you have a clear path. Avoid moving your furniture around.  If any of your floors are uneven, fix them.  If there are any pets around you, be aware of where they are.  Review your medicines with your doctor. Some medicines can make you feel dizzy. This can increase your chance of falling. Ask your doctor what other things that you can do to help prevent falls. This information is not intended to replace advice given to you by your health care provider. Make sure you discuss any questions you have with your health care provider. Document Released: 02/26/2009 Document Revised: 10/08/2015 Document Reviewed: 06/06/2014 Elsevier Interactive Patient Education  2017 [employer].

## 2020-08-17 NOTE — Progress Notes (Signed)
Subjective:   Teresa Frederick is a 75 y.o. female who presents for Medicare Annual (Subsequent) preventive examination.  Virtual Visit via Telephone Note  I connected with  Teresa Frederick on 08/17/20 at  3:30 PM EDT by telephone and verified that I am speaking with the correct person using two identifiers.  Location: Patient: Home (in car) Provider: [facility] Persons participating in the virtual visit: [place]   I discussed the limitations, risks, security and privacy concerns of performing an evaluation and management service by telephone and the availability of in person appointments. The patient expressed understanding and agreed to proceed.  Interactive audio and video telecommunications were attempted between this nurse and patient, however failed, due to patient having technical difficulties OR patient did not have access to video capability.  We continued and completed visit with audio only.  Some vital signs may be absent or patient reported.   Teresa Petrilla E Delesha Pohlman, LPN   Review of Systems     Cardiac Risk Factors include: advanced age (>34men, >3 women);dyslipidemia     Objective:    Today's Vitals   08/17/20 1524  Weight: 148 lb (67.1 kg)  Height: 5\' 5"  (1.651 m)   Body mass index is 24.63 kg/m.  Advanced Directives 04/28/2019 03/22/2019  Does Patient Have a Medical Advance Directive? Yes Yes  Type of Paramedic of [place];Living will;Out of facility DNR (pink MOST or yellow form) [place];Living will  Does patient want to make changes to medical advance directive? - No - Patient declined  Copy of [place] in Chart? - No - copy requested    Current Medications (verified) Outpatient Encounter Medications as of 08/17/2020  Medication Sig  . aspirin 325 MG tablet Take 325 mg by mouth daily.  Marland Kitchen atorvastatin (LIPITOR) 40 MG tablet Take 1 tablet (40 mg total) by mouth daily at 2 PM.  .  Calcium Carbonate-Vitamin D (CALCIUM 600+D PO) Take by mouth.  . docusate sodium (COLACE) 100 MG capsule Take 100 mg by mouth daily.   Marland Kitchen esomeprazole (NEXIUM) 20 MG capsule Take 20 mg by mouth daily at 12 noon.  Marland Kitchen FLUoxetine (PROZAC) 20 MG capsule Take 1 capsule (20 mg total) by mouth daily.  Marland Kitchen levothyroxine (SYNTHROID) 75 MCG tablet Take 1 tablet (75 mcg total) by mouth daily.  . montelukast (SINGULAIR) 10 MG tablet Take 1 tablet (10 mg total) by mouth at bedtime.  . Multiple Vitamins-Minerals (PRESERVISION AREDS PO) Take by mouth.  . Polyethylene Glycol 3350 (MIRALAX PO) Take by mouth.   . psyllium (METAMUCIL) 58.6 % powder Take 1 packet by mouth daily.  Marland Kitchen estradiol (ESTRACE) 0.1 MG/GM vaginal cream Use 0.5gm vaginally at bedtime for 2 weeks and then 2-3x weekly. (Patient not taking: Reported on 08/17/2020)  . OVER THE COUNTER MEDICATION Preser Vision (Patient not taking: Reported on 08/17/2020)  . oxybutynin (DITROPAN-XL) 10 MG 24 hr tablet Take by mouth. (Patient not taking: Reported on 08/17/2020)  . Probiotic Product (SMARTY PANTS KIDS PROBIOTIC PO) Take by mouth daily. (Patient not taking: Reported on 08/17/2020)  . [DISCONTINUED] polyethylene glycol (MIRALAX / GLYCOLAX) 17 g packet Take 17 g by mouth daily. (Patient not taking: Reported on 08/17/2020)   Facility-Administered Encounter Medications as of 08/17/2020  Medication  . ciprofloxacin (CIPRO) tablet 500 mg    Allergies (verified) Promethazine, Nsaids, Phenergan [promethazine hcl], and Sulfa antibiotics   History: Past Medical History:  Diagnosis Date  . Allergy   . Anxiety   .  Arthritis   . Cataract   . Clotting disorder ([place])   . Factor V Leiden mutation ([place])   . GERD (gastroesophageal reflux disease)   . Heart murmur   . Hyperlipidemia   . Kidney stone   . Mitral valve prolapse   . Osteoporosis   . Thyroid disease    Past Surgical History:  Procedure Laterality Date  . ABDOMINAL HYSTERECTOMY  07/25/2017  . COSMETIC  SURGERY  2018   brow/eye lift  . CYSTOCELE REPAIR     Family History  Problem Relation Age of Onset  . COPD Mother   . Diabetes Mother   . Hearing loss Mother   . Hypertension Mother   . Stroke Mother 38  . Early death Father   . Heart disease Father   . Heart attack Father 99  . Obesity Sister   . Aneurysm Sister   . COPD Brother   . Cancer Grandchild        metastatic cancer from melanoma in her eye   Social History   Socioeconomic History  . Marital status: Widowed    Spouse name: Not on file  . Number of children: 2  . Years of education: 70  . Highest education level: High school graduate  Occupational History  . Occupation: retired    Comment: Social research officer, government  Tobacco Use  . Smoking status: Never Smoker  . Smokeless tobacco: Never Used  Vaping Use  . Vaping Use: Never used  Substance and Sexual Activity  . Alcohol use: Yes    Alcohol/week: 5.0 standard drinks    Types: 5 Shots of liquor per week  . Drug use: Never  . Sexual activity: Yes    Birth control/protection: Surgical  Other Topics Concern  . Not on file  Social History Narrative   Living with significant other since 2016   Social Determinants of Health   Financial Resource Strain: Low Risk   . Difficulty of Paying Living Expenses: Not hard at all  Food Insecurity: No Food Insecurity  . Worried About Charity fundraiser in the Last Year: Never true  . Ran Out of Food in the Last Year: Never true  Transportation Needs: No Transportation Needs  . Lack of Transportation (Medical): No  . Lack of Transportation (Non-Medical): No  Physical Activity: Insufficiently Active  . Days of Exercise per Week: 7 days  . Minutes of Exercise per Session: 20 min  Stress: No Stress Concern Present  . Feeling of Stress : Not at all  Social Connections: Socially Integrated  . Frequency of Communication with Friends and Family: Twice a week  . Frequency of Social Gatherings with Friends and Family: Twice  a week  . Attends Religious Services: 1 to 4 times per year  . Active Member of Clubs or Organizations: Yes  . Attends Archivist Meetings: 1 to 4 times per year  . Marital Status: Living with partner    Tobacco Counseling Counseling given: Not Answered   Clinical Intake:  Pre-visit preparation completed: Yes  Pain : No/denies pain     BMI - recorded: 24.63 Nutritional Status: BMI of 19-24  Normal Nutritional Risks: None Diabetes: No  How often do you need to have someone help you when you read instructions, pamphlets, or other written materials from your doctor or pharmacy?: 1 - Never  Diabetic? No  Interpreter Needed?: No  Information entered by :: Teresa Ringgold, LPN   Activities of Daily Living In your present state  of health, do you have any difficulty performing the following activities: 08/17/2020  Hearing? N  Vision? N  Difficulty concentrating or making decisions? N  Walking or climbing stairs? N  Dressing or bathing? N  Doing errands, shopping? N  Preparing Food and eating ? N  Using the Toilet? N  In the past six months, have you accidently leaked urine? Y  Comment wears pads for protection  Do you have problems with loss of bowel control? N  Managing your Medications? N  Managing your Finances? N  Housekeeping or managing your Housekeeping? N  Some recent data might be hidden    Patient Care Team: Dettinger, Fransisca Kaufmann, MD as PCP - General (Family Medicine)  Indicate any recent Medical Services you may have received from other than [facility] providers in the past year (date may be approximate).     Assessment:   This is a routine wellness examination for Teresa Frederick.  Hearing/Vision screen  Hearing Screening   125Hz  250Hz  500Hz  1000Hz  2000Hz  3000Hz  4000Hz  6000Hz  8000Hz   Right ear:           Left ear:           Comments: Wears hearing aids  Vision Screening Comments: Annual visits with Dr Clide [facility]  Dietary issues  and exercise activities discussed: Current Exercise Habits: Home exercise routine, Type of exercise: walking, Time (Minutes): 20, Frequency (Times/Week): 7, Weekly Exercise (Minutes/Week): 140, Intensity: Mild, Exercise limited by: None identified  Goals    . DIET - INCREASE WATER INTAKE     Try to drink 6-8 glasses of water daily    . Exercise 150 min/wk Moderate Activity      Depression Screen PHQ 2/9 Scores 08/17/2020 01/24/2020 09/26/2019 07/24/2019 03/22/2019 01/23/2019 07/18/2018  PHQ - 2 Score 0 0 0 0 0 0 0    Fall Risk Fall Risk  08/17/2020 01/24/2020 09/26/2019 07/24/2019 03/22/2019  Falls in the past year? 0 0 1 1 0  Number falls in past yr: 0 - 0 0 -  Injury with Fall? 0 - 0 1 -  Risk for fall due to : No Fall Risks - Impaired balance/gait - -  Follow up - - Falls evaluation completed - -    FALL RISK PREVENTION PERTAINING TO THE HOME:  Any stairs in or around the home? Yes  If so, are there any without handrails? No  Home free of loose throw rugs in walkways, pet beds, electrical cords, etc? Yes  Adequate lighting in your home to reduce risk of falls? Yes   ASSISTIVE DEVICES UTILIZED TO PREVENT FALLS:  Life alert? No  Use of a cane, walker or w/c? No  Grab bars in the bathroom? Yes  Shower chair or bench in shower? No  Elevated toilet seat or a handicapped toilet? Yes   TIMED UP AND GO:  Was the test performed? No . Telephonic visit  Cognitive Function: MMSE - Mini Mental State Exam 02/22/2018  Orientation to time 5  Orientation to Place 5  Registration 3  Attention/ Calculation 5  Recall 3  Language- name 2 objects 2  Language- repeat 1  Language- follow 3 step command 3  Language- read & follow direction 1  Write a sentence 1  Copy design 1  Total score 30     6CIT Screen 03/22/2019  What Year? 0 points  What month? 0 points  What time? 0 points  Count back from 20 0 points  Months in reverse 0  points  Repeat phrase 0 points  Total Score 0     Immunizations Immunization History  Administered Date(s) Administered  . Fluad Quad(high Dose 65+) 01/14/2019  . Influenza, High Dose Seasonal PF 02/22/2018, 03/30/2020  . Moderna Sars-Covid-2 Vaccination 08/09/2019, 09/10/2019  . Pneumococcal Conjugate-13 01/24/2020  . Td 07/30/2007  . Tdap 07/30/2007, 07/23/2019, 07/24/2019    TDAP status: Up to date  Flu Vaccine status: Up to date  Pneumococcal vaccine status: Up to date  Covid-19 vaccine status: Completed vaccines  Qualifies for Shingles Vaccine? Yes   Zostavax completed No   Shingrix Completed?: No.    Education has been provided regarding the importance of this vaccine. Patient has been advised to call insurance company to determine out of pocket expense if they have not yet received this vaccine. Advised may also receive vaccine at local pharmacy or Health Dept. Verbalized acceptance and understanding.  Screening Tests Health Maintenance  Topic Date Due  . COVID-19 Vaccine (3 - Booster for Moderna series) 09/02/2020 (Originally 03/11/2020)  . Hepatitis C Screening  08/23/2021 (Originally [birth year])  . INFLUENZA VACCINE  12/14/2020  . PNA vac Low Risk Adult (2 of 2 - PPSV23) 01/23/2021  . MAMMOGRAM  12/16/2021  . COLONOSCOPY (Pts 45-14yrs Insurance coverage will need to be confirmed)  01/18/2022  . TETANUS/TDAP  07/23/2029  . DEXA SCAN  Completed  . HPV VACCINES  Aged Out    Health Maintenance  There are no preventive care reminders to display for this patient.  Colorectal cancer screening: Type of screening: Colonoscopy. Completed 01/18/2017. Repeat every 5 years  Mammogram status: Completed 12/17/2019. Repeat every year  Bone Density status: Ordered 08/17/20. Pt provided with contact info and advised to call to schedule appt.  Lung Cancer Screening: (Low Dose CT Chest recommended if Age 67-80 years, 30 pack-year currently smoking OR have quit w/in 15years.) does not qualify.   Additional  Screening:  Hepatitis C Screening: does qualify; Postponed for now as per patient, her insurance doesn't cover this.  Vision Screening: Recommended annual ophthalmology exams for early detection of glaucoma and other disorders of the eye. Is the patient up to date with their annual eye exam?  No  Who is the provider or what is the name of the office in which the patient attends annual eye exams? [place] If pt is not established with a provider, would they like to be referred to a provider to establish care? No .   Dental Screening: Recommended annual dental exams for proper oral hygiene  Community Resource Referral / Chronic Care Management: CRR required this visit?  No   CCM required this visit?  No      Plan:     I have personally reviewed and noted the following in the patient's chart:   . Medical and social history . Use of alcohol, tobacco or illicit drugs  . Current medications and supplements . Functional ability and status . Nutritional status . Physical activity . Advanced directives . List of other physicians . Hospitalizations, surgeries, and ER visits in previous 12 months . Vitals . Screenings to include cognitive, depression, and falls . Referrals and appointments  In addition, I have reviewed and discussed with patient certain preventive protocols, quality metrics, and best practice recommendations. A written personalized care plan for preventive services as well as general preventive health recommendations were provided to patient.     Sandrea Hammond, LPN   01/19/1883   Nurse Notes: Patient due for DEXA, Shingrix and Hepatitis  C screening. Per patient, insurance doesn't cover Hep C scr at this time.

## 2020-08-21 ENCOUNTER — Encounter: Payer: Self-pay | Admitting: Family Medicine

## 2020-08-21 ENCOUNTER — Other Ambulatory Visit: Payer: Self-pay

## 2020-08-21 ENCOUNTER — Ambulatory Visit (INDEPENDENT_AMBULATORY_CARE_PROVIDER_SITE_OTHER): Payer: Medicare Other | Admitting: Family Medicine

## 2020-08-21 VITALS — BP 116/63 | HR 51 | Ht 65.0 in | Wt 151.0 lb

## 2020-08-21 DIAGNOSIS — J309 Allergic rhinitis, unspecified: Secondary | ICD-10-CM | POA: Diagnosis not present

## 2020-08-21 DIAGNOSIS — E039 Hypothyroidism, unspecified: Secondary | ICD-10-CM

## 2020-08-21 DIAGNOSIS — Z780 Asymptomatic menopausal state: Secondary | ICD-10-CM | POA: Diagnosis not present

## 2020-08-21 DIAGNOSIS — E782 Mixed hyperlipidemia: Secondary | ICD-10-CM

## 2020-08-21 DIAGNOSIS — F411 Generalized anxiety disorder: Secondary | ICD-10-CM | POA: Diagnosis not present

## 2020-08-21 MED ORDER — LEVOTHYROXINE SODIUM 75 MCG PO TABS
75.0000 ug | ORAL_TABLET | Freq: Every day | ORAL | 3 refills | Status: DC
Start: 2020-08-21 — End: 2020-08-24

## 2020-08-21 MED ORDER — ATORVASTATIN CALCIUM 40 MG PO TABS
40.0000 mg | ORAL_TABLET | Freq: Every day | ORAL | 3 refills | Status: DC
Start: 2020-08-21 — End: 2020-08-24

## 2020-08-21 MED ORDER — FLUOXETINE HCL 20 MG PO CAPS: 20.0000 mg | ORAL_CAPSULE | Freq: Every day | ORAL | 3 refills | Status: DC

## 2020-08-21 MED ORDER — MONTELUKAST SODIUM 10 MG PO TABS
10.0000 mg | ORAL_TABLET | Freq: Every day | ORAL | 3 refills | Status: DC
Start: 2020-08-21 — End: 2020-08-24

## 2020-08-21 NOTE — Progress Notes (Signed)
BP 116/63   Pulse (!) 51   Ht 5' 5"  (1.651 m)   Wt 151 lb (68.5 kg)   SpO2 98%   BMI 25.13 kg/m    Subjective:   Patient ID: Teresa Frederick, female    DOB: [birth year], 75 y.o.   MRN: [id number]  HPI: Teresa Frederick is a 75 y.o. female presenting on 08/21/2020 for Medical Management of Chronic Issues, Hyperlipidemia, and Hypothyroidism   HPI Hyperlipidemia Patient is coming in for recheck of his hyperlipidemia. The patient is currently taking atorvastatin. They deny any issues with myalgias or history of liver damage from it. They deny any focal numbness or weakness or chest pain.   Hypothyroidism recheck Patient is coming in for thyroid recheck today as well. They deny any issues with hair changes or heat or cold problems or diarrhea or constipation. They deny any chest pain or palpitations. They are currently on levothyroxine 22mcrograms   Anxiety and depression recheck Patient is currently coming in for anxiety depression recheck.  She is currently takes the Prozac and is very satisfied with it and feels like it is doing very well for her. [facility]2020 08/17/2020 01/24/2020 09/26/2019 07/24/2019  Decreased Interest 0 0 0 0 0  Down, Depressed, Hopeless 0 0 0 0 0  PHQ - 2 Score 0 0 0 0 0     Relevant past medical, surgical, family and social history reviewed and updated as indicated. Interim medical history since our last visit reviewed. Allergies and medications reviewed and updated.  Review of Systems  Constitutional: Negative for chills and fever.  HENT: Negative for congestion, ear discharge and ear pain.   Eyes: Negative for visual disturbance.  Respiratory: Negative for chest tightness and shortness of breath.   Cardiovascular: Negative for chest pain and leg swelling.  Genitourinary: Negative for difficulty urinating and dysuria.  Musculoskeletal: Negative for back pain and gait problem.  Skin: Negative for rash.  Neurological: Positive for  numbness. Negative for weakness, light-headedness and headaches.  Psychiatric/Behavioral: Negative for agitation and behavioral problems.  All other systems reviewed and are negative.   Per HPI unless specifically indicated above   Allergies as of 08/21/2020      Reactions   Promethazine Anaphylaxis, Swelling   tongue swelling  Tongue swells Tongue swells tongue swelling    Nsaids Other (See Comments)   GI bleeding and Barrett's Esophagus   Phenergan [promethazine Hcl] Other (See Comments)   tongue swelling    Sulfa Antibiotics       Medication List       Accurate as of August 21, 2020 12:54 PM. If you have any questions, ask your nurse or doctor.        STOP taking these medications   estradiol 0.1 MG/GM vaginal cream Commonly known as: ESTRACE Stopped by: JFransisca KaufmannDettinger, MD   oxybutynin 10 MG 24 hr tablet Commonly known as: DITROPAN-XL Stopped by: JFransisca KaufmannDettinger, MD     TAKE these medications   aspirin 325 MG tablet Take 325 mg by mouth daily.   atorvastatin 40 MG tablet Commonly known as: LIPITOR Take 1 tablet (40 mg total) by mouth daily at 2 PM.   CALCIUM 600+D PO Take by mouth.   docusate sodium 100 MG capsule Commonly known as: COLACE Take 100 mg by mouth daily.   esomeprazole 20 MG capsule Commonly known as: NEXIUM Take 20 mg by mouth daily at 12 noon.   FLUoxetine 20 MG capsule Commonly known  as: PROZAC Take 1 capsule (20 mg total) by mouth daily.   levothyroxine 75 MCG tablet Commonly known as: SYNTHROID Take 1 tablet (75 mcg total) by mouth daily.   MIRALAX PO Take by mouth.   montelukast 10 MG tablet Commonly known as: SINGULAIR Take 1 tablet (10 mg total) by mouth at bedtime.   OVER THE COUNTER MEDICATION Preser Vision   PRESERVISION AREDS PO Take by mouth.   psyllium 58.6 % powder Commonly known as: METAMUCIL Take 1 packet by mouth daily.   SMARTY PANTS KIDS PROBIOTIC PO Take by mouth daily.        Objective:    BP 116/63   Pulse (!) 51   Ht 5' 5"  (1.651 m)   Wt 151 lb (68.5 kg)   SpO2 98%   BMI 25.13 kg/m   Wt Readings from Last 3 Encounters:  08/21/20 151 lb (68.5 kg)  08/17/20 148 lb (67.1 kg)  02/14/20 142 lb (64.4 kg)    Physical Exam Vitals and nursing note reviewed.  Constitutional:      General: She is not in acute distress.    Appearance: She is well-developed. She is not diaphoretic.  Eyes:     Conjunctiva/sclera: Conjunctivae normal.  Cardiovascular:     Rate and Rhythm: Normal rate and regular rhythm.     Heart sounds: Normal heart sounds. No murmur heard.   Pulmonary:     Effort: Pulmonary effort is normal. No respiratory distress.     Breath sounds: Normal breath sounds. No wheezing.  Musculoskeletal:        General: No tenderness. Normal range of motion.  Skin:    General: Skin is warm and dry.     Findings: No rash.  Neurological:     Mental Status: She is alert and oriented to person, place, and time.     Coordination: Coordination normal.  Psychiatric:        Behavior: Behavior normal.       Assessment & Plan:   Problem List Items Addressed This Visit      Endocrine   Hypothyroidism   Relevant Medications   levothyroxine (SYNTHROID) 75 MCG tablet   Other Relevant Orders   TSH     Other   Hyperlipemia   Relevant Medications   atorvastatin (LIPITOR) 40 MG tablet   Other Relevant Orders   CMP14+EGFR   Lipid panel   GAD (generalized anxiety disorder)   Relevant Medications   FLUoxetine (PROZAC) 20 MG capsule   Other Relevant Orders   CBC with Differential/Platelet    Other Visit Diagnoses    Postmenopausal    -  Primary   Relevant Orders   DG [facility] DEXA   Chronic allergic rhinitis       Relevant Medications   montelukast (SINGULAIR) 10 MG tablet      Patient gets random neuropathic pain especially on the right side of her chest and down her right leg.  We discussed possibly going to see a neurologist versus taking B12, she will try  B12 for now and will see about seeing a neurologist in the future. Follow up plan: Return in about 6 months (around 02/20/2021), or if symptoms worsen or fail to improve, for Hyperlipidemia and hypothyroidism.  Counseling provided for all of the vaccine components No orders of the defined types were placed in this encounter.   Caryl Pina, MD [facility] 08/21/2020, 12:55 PM

## 2020-08-22 LAB — CBC WITH DIFFERENTIAL/PLATELET
Basophils Absolute: 0.1 10*3/uL (ref 0.0–0.2)
Basos: 1 %
EOS (ABSOLUTE): 0.1 10*3/uL (ref 0.0–0.4)
Eos: 2 %
Hematocrit: 41 % (ref 34.0–46.6)
Hemoglobin: 13.5 g/dL (ref 11.1–15.9)
Immature Grans (Abs): 0 10*3/uL (ref 0.0–0.1)
Immature Granulocytes: 0 %
Lymphocytes Absolute: 2.3 10*3/uL (ref 0.7–3.1)
Lymphs: 45 %
MCH: 31.4 pg (ref 26.6–33.0)
MCHC: 32.9 g/dL (ref 31.5–35.7)
MCV: 95 fL (ref 79–97)
Monocytes Absolute: 0.6 10*3/uL (ref 0.1–0.9)
Monocytes: 11 %
Neutrophils Absolute: 2.1 10*3/uL (ref 1.4–7.0)
Neutrophils: 41 %
Platelets: 350 10*3/uL (ref 150–450)
RBC: 4.3 x10E6/uL (ref 3.77–5.28)
RDW: 12.5 % (ref 11.7–15.4)
WBC: 5.1 10*3/uL (ref 3.4–10.8)

## 2020-08-22 LAB — LIPID PANEL
Chol/HDL Ratio: 2.8 ratio (ref 0.0–4.4)
Cholesterol, Total: 194 mg/dL (ref 100–199)
HDL: 70 mg/dL (ref >39)
LDL Chol Calc (NIH): 110 mg/dL — ABNORMAL HIGH (ref 0–99)
Triglycerides: 77 mg/dL (ref 0–149)
VLDL Cholesterol Cal: 14 mg/dL (ref 5–40)

## 2020-08-22 LAB — CMP14+EGFR
ALT: 17 IU/L (ref 0–32)
AST: 28 IU/L (ref 0–40)
Albumin/Globulin Ratio: 2 (ref 1.2–2.2)
Albumin: 4.4 g/dL (ref 3.7–4.7)
Alkaline Phosphatase: 78 IU/L (ref 44–121)
BUN/Creatinine Ratio: 19 (ref 12–28)
BUN: 14 mg/dL (ref 8–27)
Bilirubin Total: 0.5 mg/dL (ref 0.0–1.2)
CO2: 23 mmol/L (ref 20–29)
Calcium: 9.2 mg/dL (ref 8.7–10.3)
Chloride: 104 mmol/L (ref 96–106)
Creatinine, Ser: 0.74 mg/dL (ref 0.57–1.00)
Globulin, Total: 2.2 g/dL (ref 1.5–4.5)
Glucose: 89 mg/dL (ref 65–99)
Potassium: 4.3 mmol/L (ref 3.5–5.2)
Sodium: 143 mmol/L (ref 134–144)
Total Protein: 6.6 g/dL (ref 6.0–8.5)
eGFR: 85 mL/min/{1.73_m2} (ref >59)

## 2020-08-22 LAB — TSH: TSH: 1.7 u[IU]/mL (ref 0.450–4.500)

## 2020-08-24 ENCOUNTER — Telehealth: Payer: Self-pay

## 2020-08-24 DIAGNOSIS — F411 Generalized anxiety disorder: Secondary | ICD-10-CM

## 2020-08-24 DIAGNOSIS — E782 Mixed hyperlipidemia: Secondary | ICD-10-CM

## 2020-08-24 DIAGNOSIS — E039 Hypothyroidism, unspecified: Secondary | ICD-10-CM

## 2020-08-24 DIAGNOSIS — J309 Allergic rhinitis, unspecified: Secondary | ICD-10-CM

## 2020-08-24 MED ORDER — LEVOTHYROXINE SODIUM 75 MCG PO TABS: 75.0000 ug | ORAL_TABLET | Freq: Every day | ORAL | 3 refills | Status: DC

## 2020-08-24 MED ORDER — FLUOXETINE HCL 20 MG PO CAPS: 20.0000 mg | ORAL_CAPSULE | Freq: Every day | ORAL | 3 refills | Status: DC

## 2020-08-24 MED ORDER — ATORVASTATIN CALCIUM 40 MG PO TABS: 40.0000 mg | ORAL_TABLET | Freq: Every day | ORAL | 3 refills | Status: DC

## 2020-08-24 MED ORDER — MONTELUKAST SODIUM 10 MG PO TABS: 10.0000 mg | ORAL_TABLET | Freq: Every day | ORAL | 3 refills | Status: DC

## 2020-08-24 NOTE — Telephone Encounter (Signed)
Meds sent to mail order

## 2020-11-03 IMAGING — CT CT CERVICAL SPINE W/O CM
3 of 4 series · 12 of 33 positions shown, 14 images · non-contrast
Comparison: None.

CLINICAL DATA: Neck pain and facial bruising after fall 3 days ago.

EXAM:
CT HEAD WITHOUT CONTRAST
CT MAXILLOFACIAL WITHOUT CONTRAST
CT CERVICAL SPINE WITHOUT CONTRAST
TECHNIQUE: Multidetector CT imaging of the head, cervical spine, and
maxillofacial structures were performed using the standard protocol
without intravenous contrast. Multiplanar CT image reconstructions
of the cervical spine and maxillofacial structures were also
generated.

[Series 5: sag bone · sagittal · 0.28mm/px · 5 of 61 slices shown, 6 images]
[im 21/61  bone]
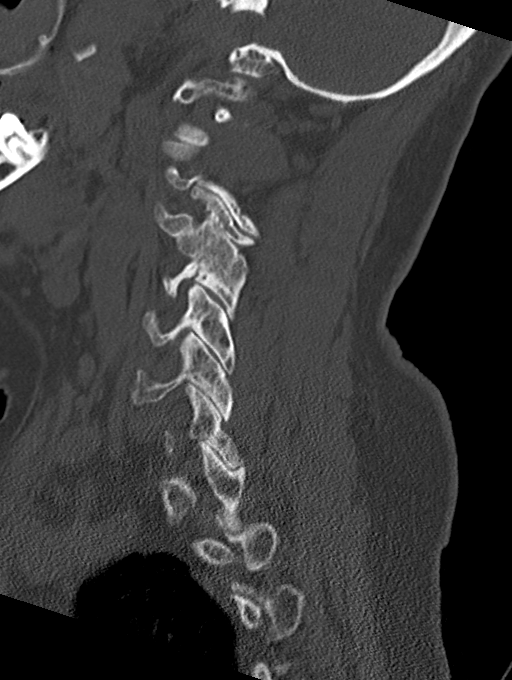
[im 26/61  bone]
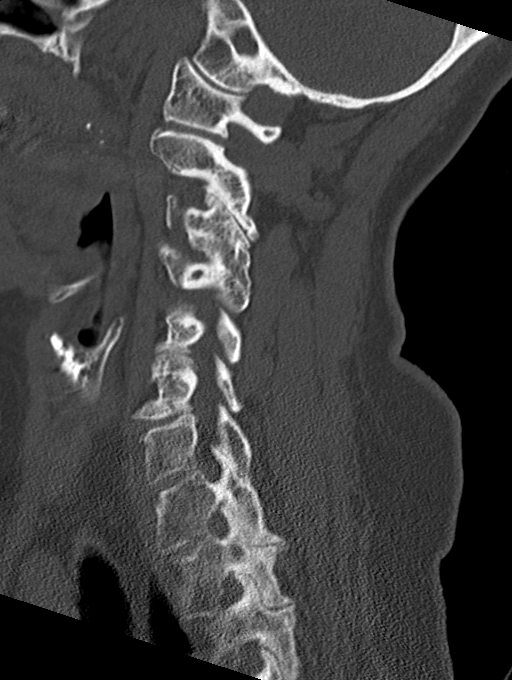
[im 31/61  soft-tissue]
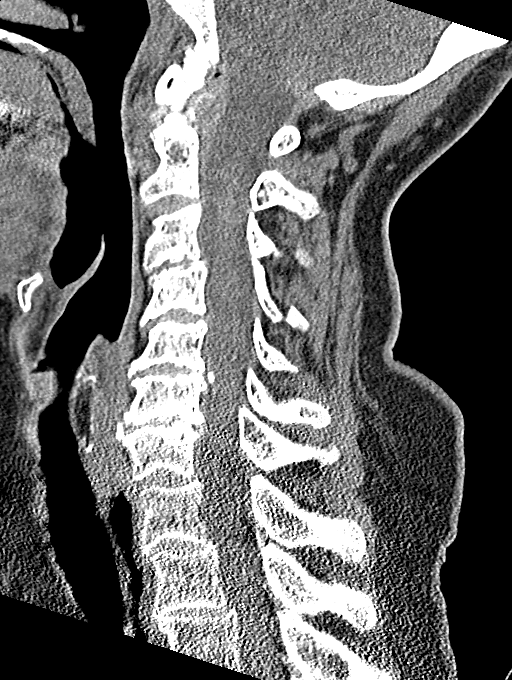
[im 31/61  bone]
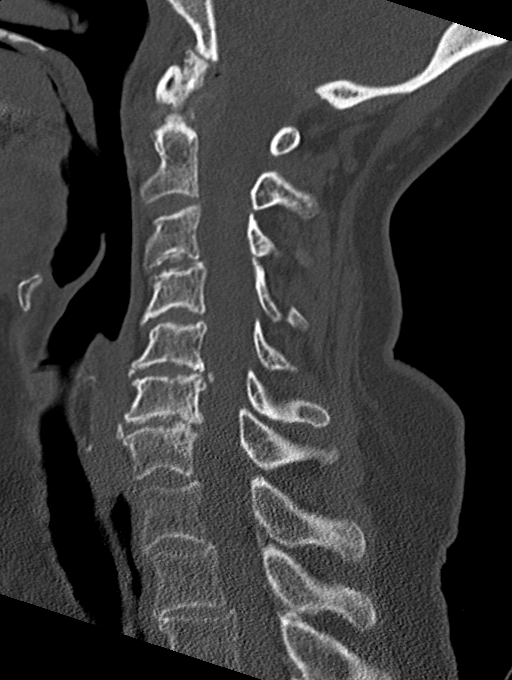
[im 36/61  bone]
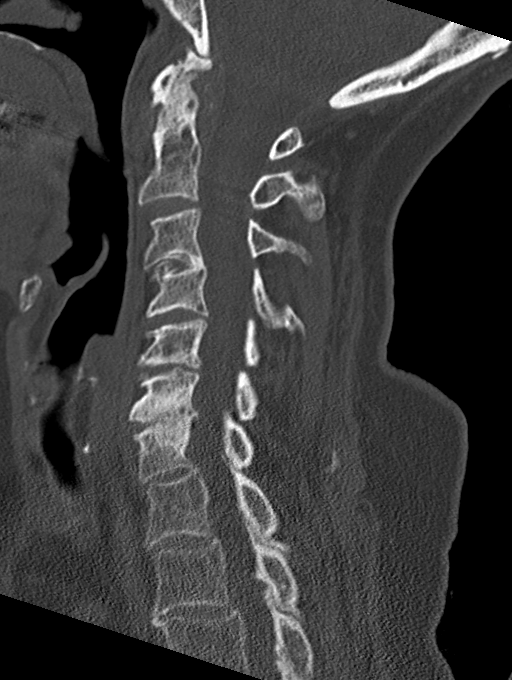
[im 41/61  bone]
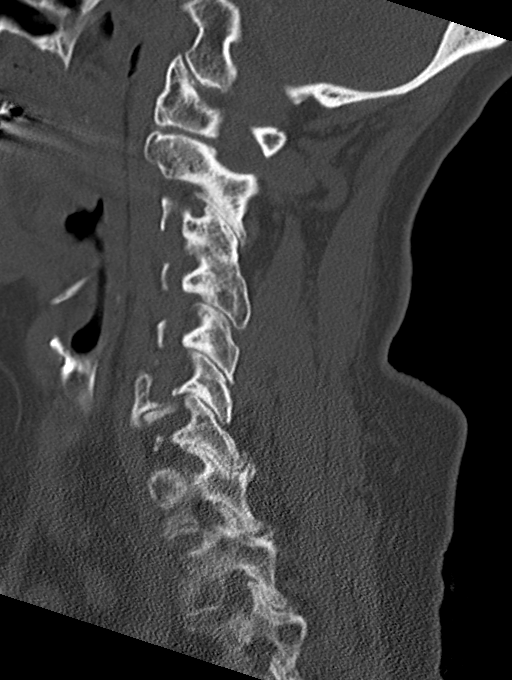

[Series 6: cor bone · coronal · 0.28mm/px · 3 of 61 slices shown]
[im 13/61  bone]
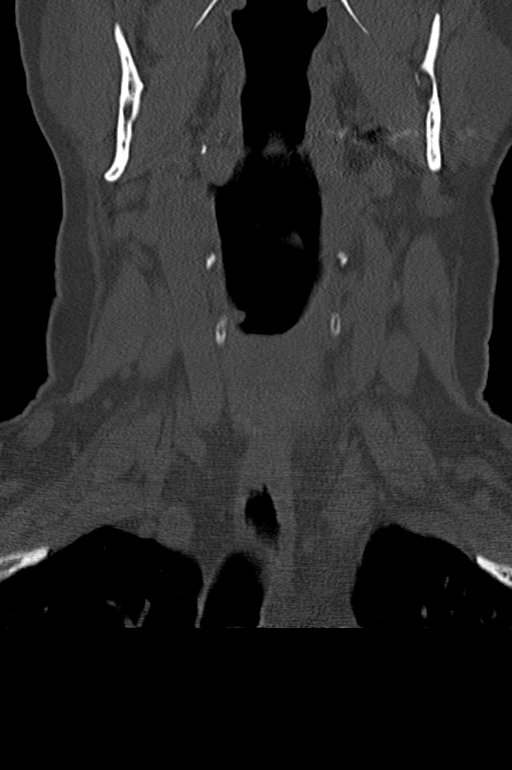
[im 25/61  bone]
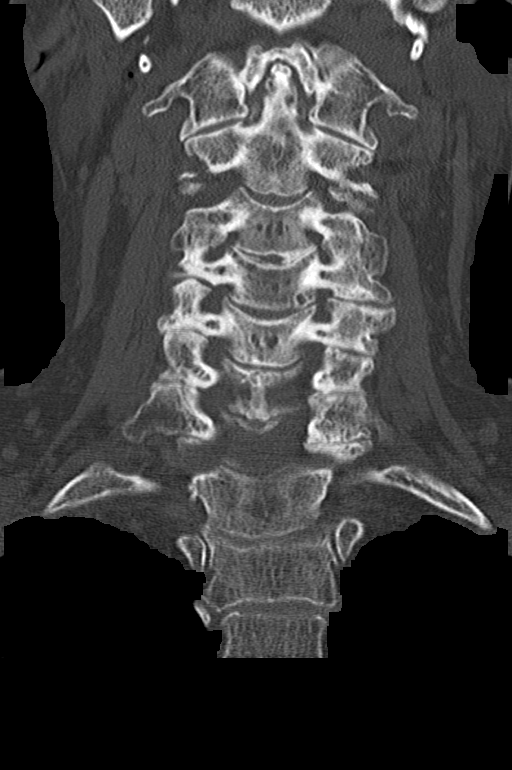
[im 37/61  bone]
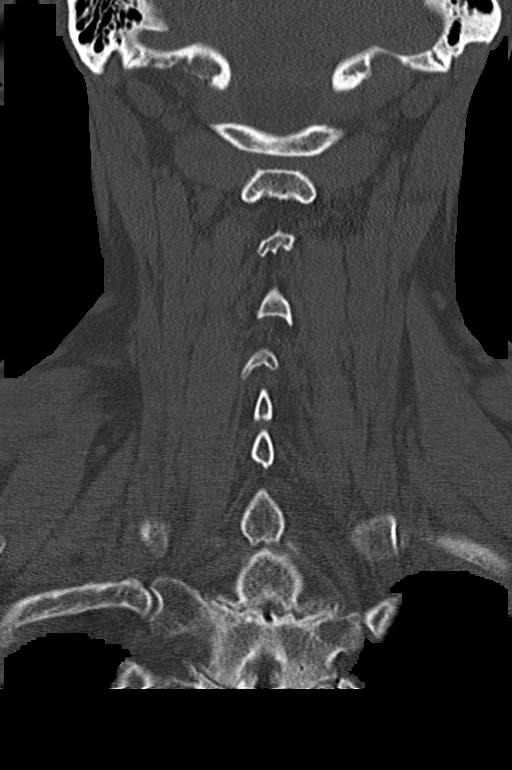

[Series 7: orthogonal axials · axial · 0.21mm/px · z∈[+1203,+1321]mm · 4 of 93 slices shown, 5 images]
[im 16/93  soft-tissue]
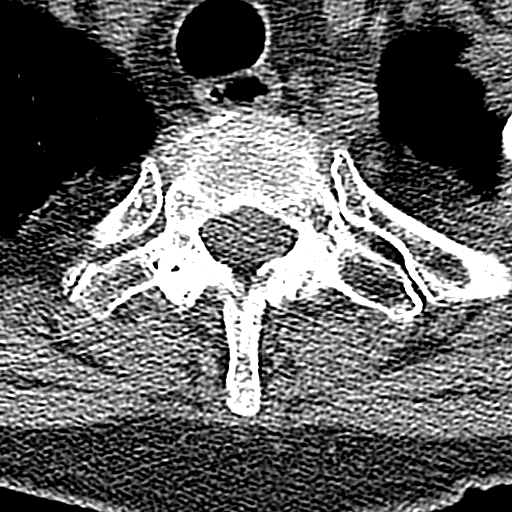
[im 16/93  bone]
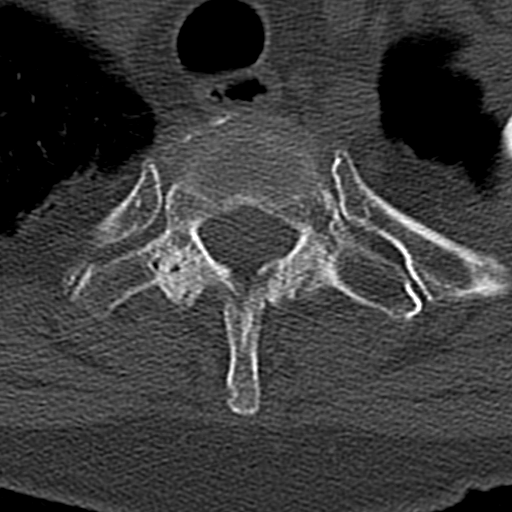
[im 31/93  bone]
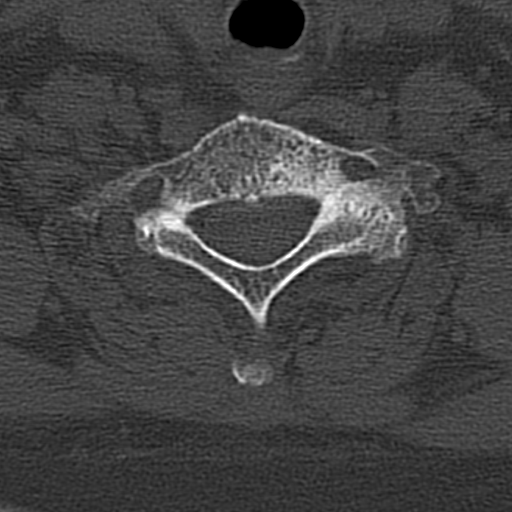
[im 62/93  bone]
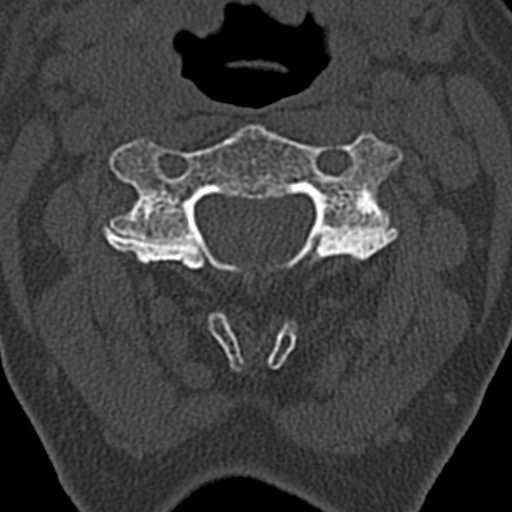
[im 77/93  bone]
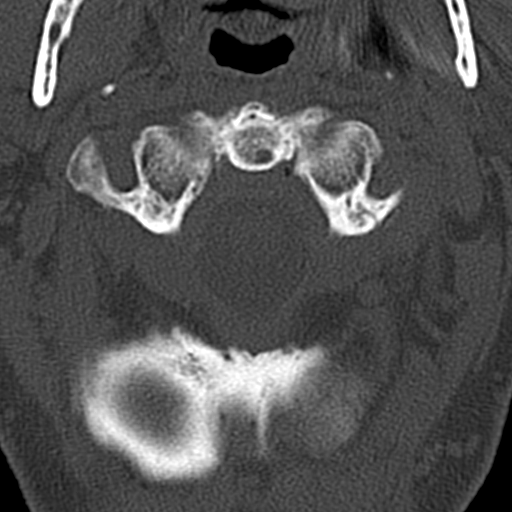

[12 of 33 positions shown; findings below may reference images not displayed]

FINDINGS: CT HEAD FINDINGS

Brain: Mild chronic ischemic white matter disease is noted. No mass
effect or midline shift is noted. Ventricular size is within normal
limits. There is no evidence of mass lesion, hemorrhage or acute
infarction.

Vascular: No hyperdense vessel or unexpected calcification.

Skull: Normal. Negative for fracture or focal lesion.

Other: None.

CT MAXILLOFACIAL FINDINGS

Osseous: Minimally displaced fracture is seen involving the anterior
wall of the right maxillary sinus, with another probable minimally
displaced fracture involving its posterior portion. Fluid level is
noted in the right maxillary sinus most consistent with hemorrhage.

Orbits: Negative. No traumatic or inflammatory finding.

Sinuses: As noted above, probable minimally displaced fractures are
seen involving the anterior and posterior portions of the right
maxillary sinus.

Soft tissues: Soft tissue gas is seen overlying the right mandible
and maxillary regions consistent with trauma.

CT CERVICAL SPINE FINDINGS

Alignment: Minimal grade 1 anterolisthesis of C3-4 is noted
secondary to posterior facet joint hypertrophy.

Skull base and vertebrae: No acute fracture. No primary bone lesion
or focal pathologic process.

Soft tissues and spinal canal: No prevertebral fluid or swelling. No
visible canal hematoma.

Disc levels: Moderate degenerative disc disease is noted at C3-4,
C5-6 and C6-7.

Upper chest: Negative.

Other: There appears to be fusion and hypertrophy of the bilateral
facet joints of C3-4 most likely due to degenerative disease.
IMPRESSION: Mild chronic ischemic white matter disease. No acute intracranial
abnormality seen.

Probable minimally displaced fracture seen involving the anterior
and posterior walls of the right maxillary sinus, with associated
probable hemorrhage within the right maxillary sinus. Extensive soft
tissue gas is seen in the right mandibular and maxillary regions
consistent with trauma.

Moderate multilevel degenerative disc disease. No acute abnormality
seen in the cervical spine.

## 2020-11-03 IMAGING — CT CT HEAD W/O CM
3 series · 15 of 37 positions shown, 17 images · non-contrast
Comparison: None.

CLINICAL DATA: Neck pain and facial bruising after fall 3 days ago.

EXAM:
CT HEAD WITHOUT CONTRAST
CT MAXILLOFACIAL WITHOUT CONTRAST
CT CERVICAL SPINE WITHOUT CONTRAST
TECHNIQUE: Multidetector CT imaging of the head, cervical spine, and
maxillofacial structures were performed using the standard protocol
without intravenous contrast. Multiplanar CT image reconstructions
of the cervical spine and maxillofacial structures were also
generated.

[Series 2: head w o · axial · 0.47mm/px · z∈[+1385,+1505]mm · 7 of 32 slices shown, 9 images]
[im 4/32  brain]
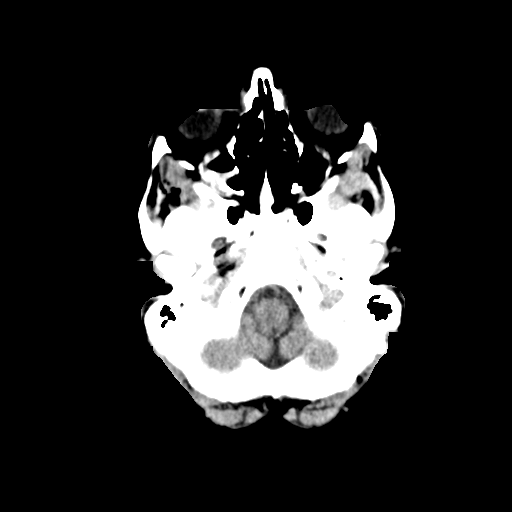
[im 4/32  bone]
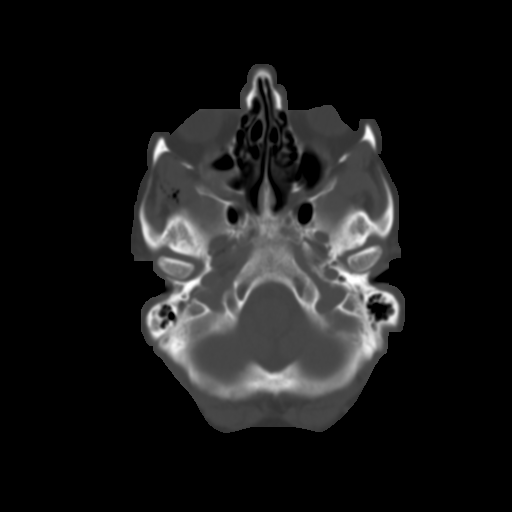
[im 8/32  brain]
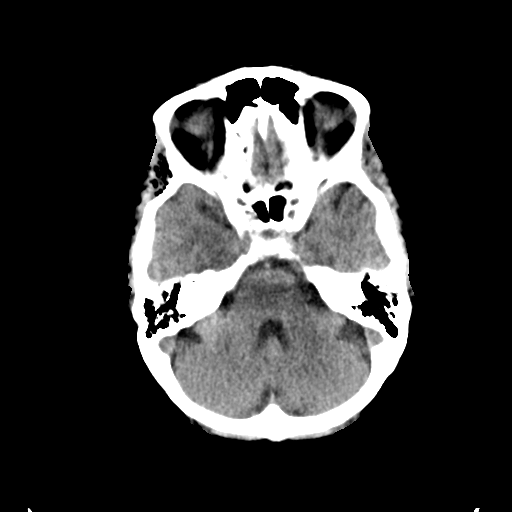
[im 12/32  brain]
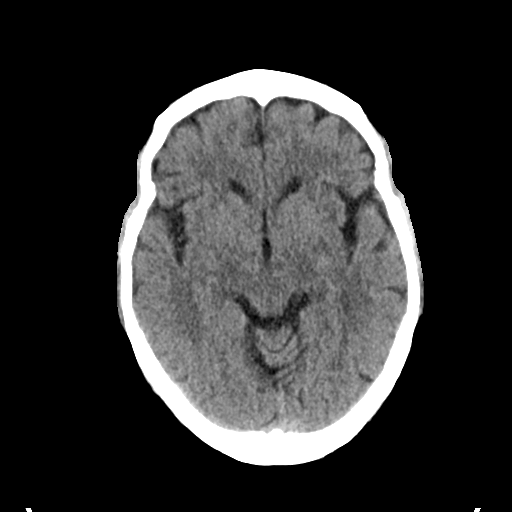
[im 16/32  brain]
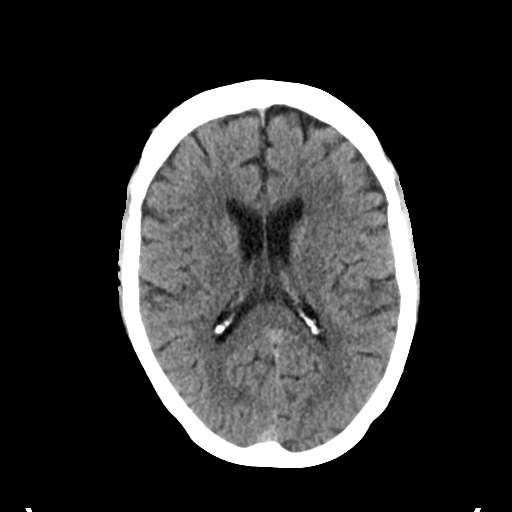
[im 20/32  brain]
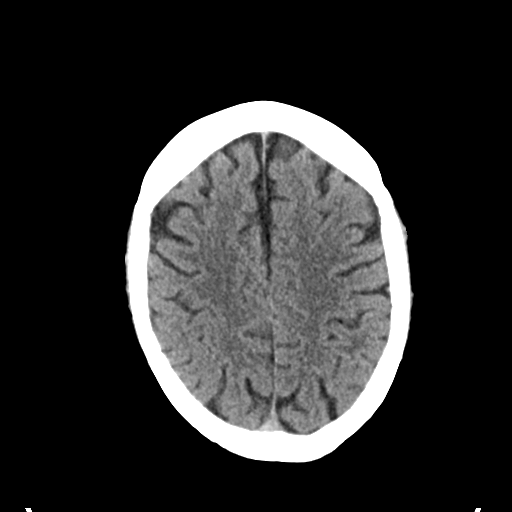
[im 20/32  bone]
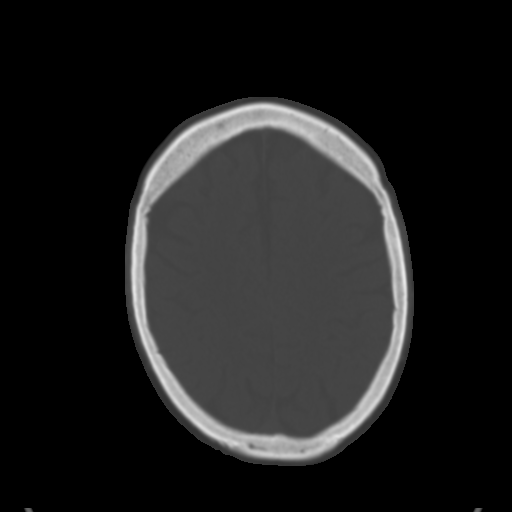
[im 24/32  brain]
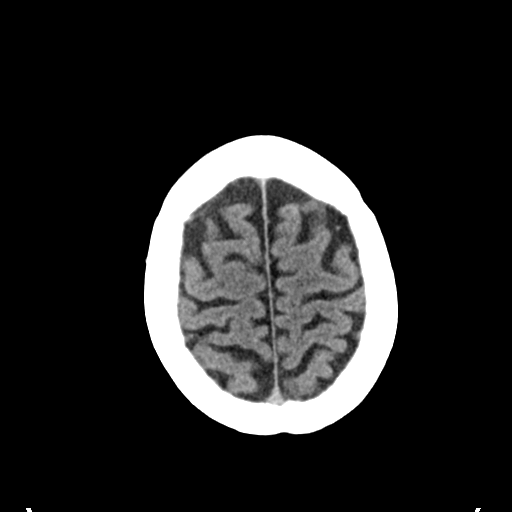
[im 28/32  brain]
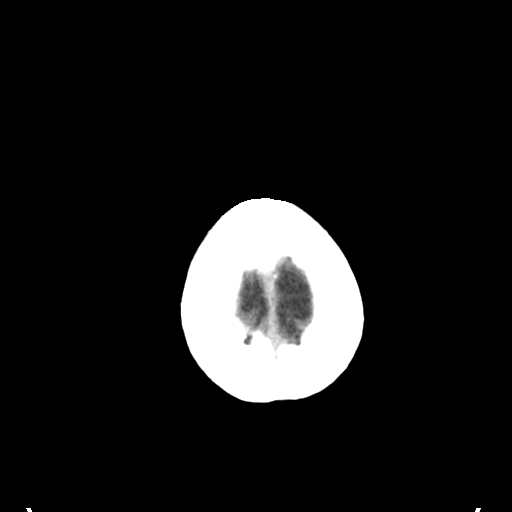

[Series 3: head bone · axial · 0.47mm/px · z∈[+1384,+1454]mm · 5 of 79 slices shown]
[im 8/79  bone]
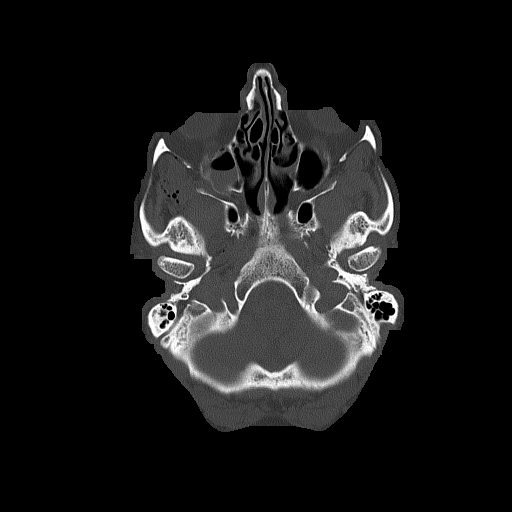
[im 16/79  bone]
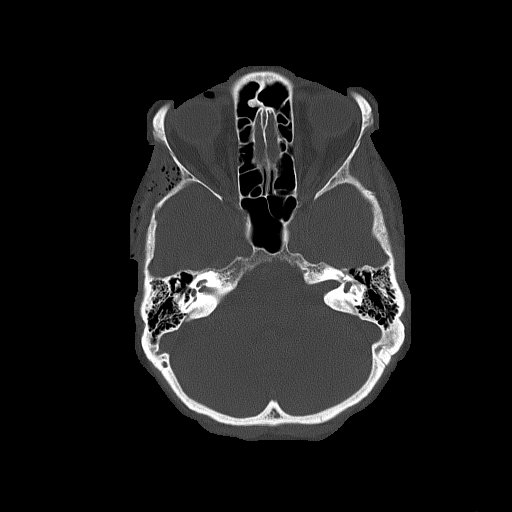
[im 24/79  bone]
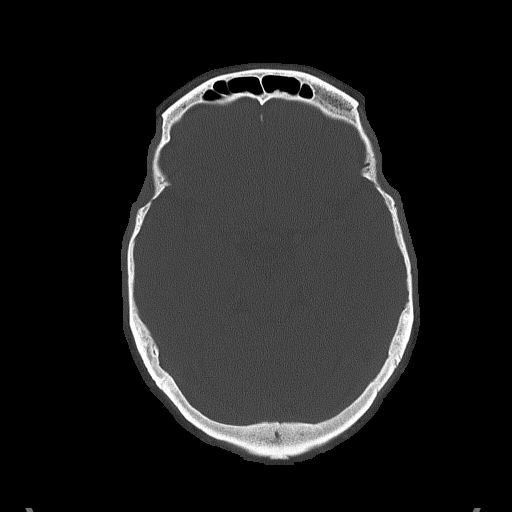
[im 36/79  bone]
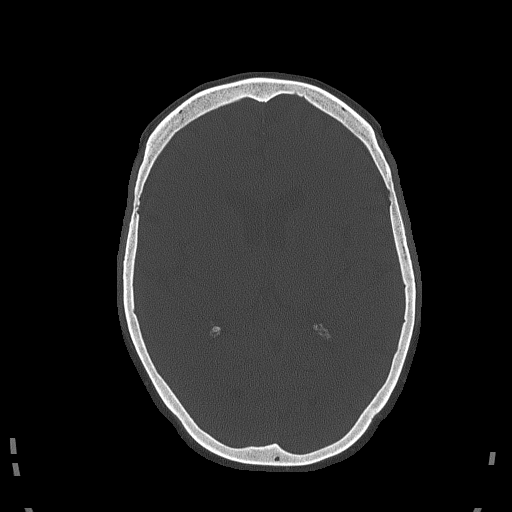
[im 43/79  bone]
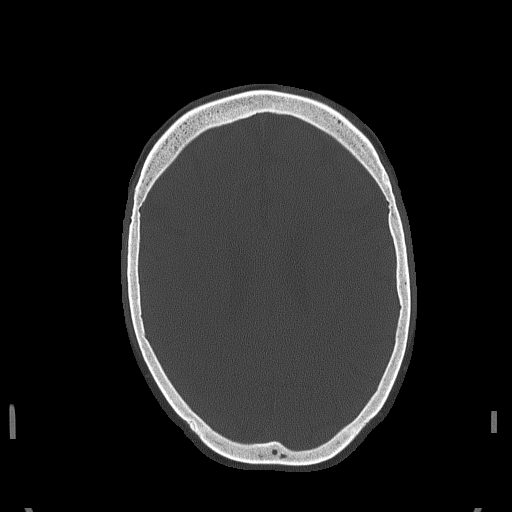

[Series 5: sagittal soft · sagittal · 0.31mm/px · 3 of 67 slices shown]
[im 23/67  brain]
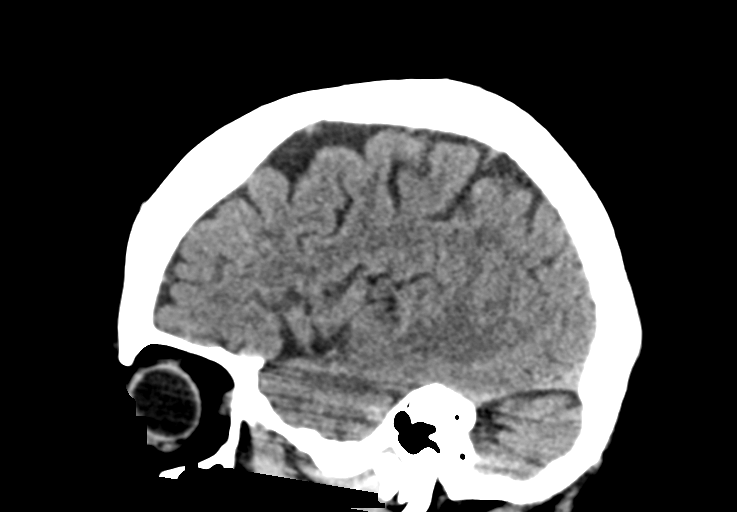
[im 34/67  brain]
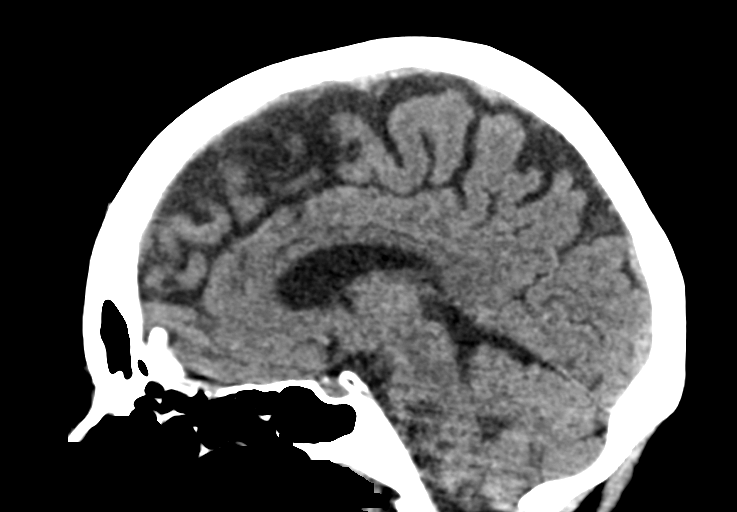
[im 45/67  brain]
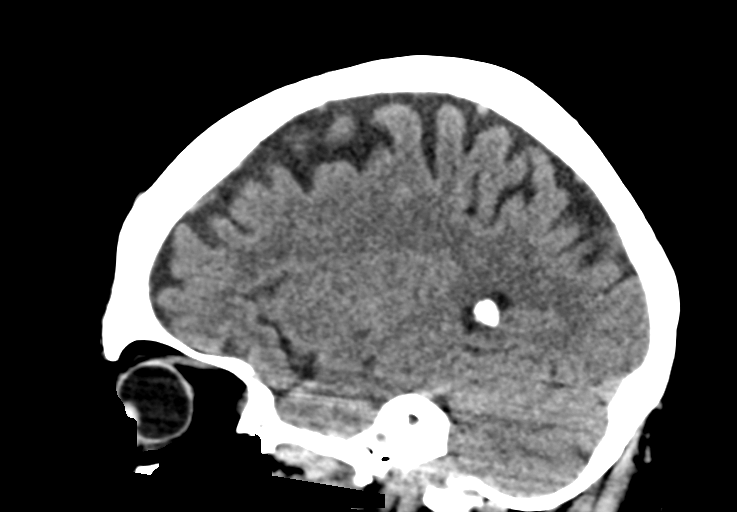

[15 of 37 positions shown; findings below may reference images not displayed]

FINDINGS: CT HEAD FINDINGS

Brain: Mild chronic ischemic white matter disease is noted. No mass
effect or midline shift is noted. Ventricular size is within normal
limits. There is no evidence of mass lesion, hemorrhage or acute
infarction.

Vascular: No hyperdense vessel or unexpected calcification.

Skull: Normal. Negative for fracture or focal lesion.

Other: None.

CT MAXILLOFACIAL FINDINGS

Osseous: Minimally displaced fracture is seen involving the anterior
wall of the right maxillary sinus, with another probable minimally
displaced fracture involving its posterior portion. Fluid level is
noted in the right maxillary sinus most consistent with hemorrhage.

Orbits: Negative. No traumatic or inflammatory finding.

Sinuses: As noted above, probable minimally displaced fractures are
seen involving the anterior and posterior portions of the right
maxillary sinus.

Soft tissues: Soft tissue gas is seen overlying the right mandible
and maxillary regions consistent with trauma.

CT CERVICAL SPINE FINDINGS

Alignment: Minimal grade 1 anterolisthesis of C3-4 is noted
secondary to posterior facet joint hypertrophy.

Skull base and vertebrae: No acute fracture. No primary bone lesion
or focal pathologic process.

Soft tissues and spinal canal: No prevertebral fluid or swelling. No
visible canal hematoma.

Disc levels: Moderate degenerative disc disease is noted at C3-4,
C5-6 and C6-7.

Upper chest: Negative.

Other: There appears to be fusion and hypertrophy of the bilateral
facet joints of C3-4 most likely due to degenerative disease.
IMPRESSION: Mild chronic ischemic white matter disease. No acute intracranial
abnormality seen.

Probable minimally displaced fracture seen involving the anterior
and posterior walls of the right maxillary sinus, with associated
probable hemorrhage within the right maxillary sinus. Extensive soft
tissue gas is seen in the right mandibular and maxillary regions
consistent with trauma.

Moderate multilevel degenerative disc disease. No acute abnormality
seen in the cervical spine.

## 2021-01-15 ENCOUNTER — Telehealth: Payer: Self-pay | Admitting: Family Medicine

## 2021-01-15 DIAGNOSIS — E039 Hypothyroidism, unspecified: Secondary | ICD-10-CM

## 2021-01-15 DIAGNOSIS — F411 Generalized anxiety disorder: Secondary | ICD-10-CM

## 2021-01-15 DIAGNOSIS — E782 Mixed hyperlipidemia: Secondary | ICD-10-CM

## 2021-01-15 DIAGNOSIS — J309 Allergic rhinitis, unspecified: Secondary | ICD-10-CM

## 2021-01-15 MED ORDER — FLUOXETINE HCL 20 MG PO CAPS: 20.0000 mg | ORAL_CAPSULE | Freq: Every day | ORAL | 1 refills | Status: DC

## 2021-01-15 MED ORDER — LEVOTHYROXINE SODIUM 75 MCG PO TABS: 75.0000 ug | ORAL_TABLET | Freq: Every day | ORAL | 1 refills | Status: DC

## 2021-01-15 MED ORDER — ATORVASTATIN CALCIUM 40 MG PO TABS: 40.0000 mg | ORAL_TABLET | Freq: Every day | ORAL | 1 refills | Status: DC

## 2021-01-15 MED ORDER — MONTELUKAST SODIUM 10 MG PO TABS: 10.0000 mg | ORAL_TABLET | Freq: Every day | ORAL | 1 refills | Status: DC

## 2021-01-15 NOTE — Telephone Encounter (Signed)
Call with [facility], when refill sent in April there was an old Rx on file with them so new ones were marked duplicate and cancelled out. Sending new ones until April.  Pt aware refills sent to [facility]

## 2021-01-15 NOTE — Telephone Encounter (Signed)
Pt called stating that she needs [place] to send all of her Rx's to [facility] mail order. [facility] [place] if she needs to pick up medicine right away. Pt also says she talked to someone from [facility] yesterday and they told her they couldn't fill her Fluoxetine Rx because it was expired.  Please advise and call patient.

## 2021-01-21 ENCOUNTER — Other Ambulatory Visit (HOSPITAL_COMMUNITY): Payer: Self-pay | Admitting: Family Medicine

## 2021-01-21 DIAGNOSIS — Z1231 Encounter for screening mammogram for malignant neoplasm of breast: Secondary | ICD-10-CM

## 2021-01-27 ENCOUNTER — Other Ambulatory Visit: Payer: Self-pay

## 2021-01-27 ENCOUNTER — Ambulatory Visit (HOSPITAL_COMMUNITY)
Admission: RE | Admit: 2021-01-27 | Discharge: 2021-01-27 | Disposition: A | Discharge status: Home / Self Care | Payer: Medicare Other | Source: Ambulatory Visit | Attending: Family Medicine | Admitting: Family Medicine

## 2021-01-27 DIAGNOSIS — Z1231 Encounter for screening mammogram for malignant neoplasm of breast: Secondary | ICD-10-CM | POA: Diagnosis not present

## 2021-02-19 ENCOUNTER — Ambulatory Visit (INDEPENDENT_AMBULATORY_CARE_PROVIDER_SITE_OTHER): Payer: Medicare Other | Admitting: Family Medicine

## 2021-02-19 ENCOUNTER — Ambulatory Visit (INDEPENDENT_AMBULATORY_CARE_PROVIDER_SITE_OTHER): Payer: Medicare Other

## 2021-02-19 ENCOUNTER — Encounter: Payer: Self-pay | Admitting: Family Medicine

## 2021-02-19 ENCOUNTER — Other Ambulatory Visit: Payer: Self-pay

## 2021-02-19 VITALS — BP 117/67 | HR 53 | Temp 97.9°F | Ht 65.0 in | Wt 142.6 lb

## 2021-02-19 DIAGNOSIS — E039 Hypothyroidism, unspecified: Secondary | ICD-10-CM | POA: Diagnosis not present

## 2021-02-19 DIAGNOSIS — M8589 Other specified disorders of bone density and structure, multiple sites: Secondary | ICD-10-CM | POA: Diagnosis not present

## 2021-02-19 DIAGNOSIS — E782 Mixed hyperlipidemia: Secondary | ICD-10-CM | POA: Diagnosis not present

## 2021-02-19 DIAGNOSIS — Z780 Asymptomatic menopausal state: Secondary | ICD-10-CM

## 2021-02-19 DIAGNOSIS — F411 Generalized anxiety disorder: Secondary | ICD-10-CM

## 2021-02-19 DIAGNOSIS — F339 Major depressive disorder, recurrent, unspecified: Secondary | ICD-10-CM

## 2021-02-19 NOTE — Progress Notes (Signed)
BP 117/67   Pulse (!) 53   Temp 97.9 F (36.6 C)   Ht 5' 5"  (1.651 m)   Wt 142 lb 9.6 oz (64.7 kg)   SpO2 97%   BMI 23.73 kg/m    Subjective:   Patient ID: Teresa Frederick, female    DOB: 1945-06-19, 75 y.o.   MRN: [id number]  HPI: Teresa Frederick is a 75 y.o. female presenting on 02/19/2021 for Medical Management of Chronic Issues   HPI Hypothyroidism recheck Patient is coming in for thyroid recheck today as well. They deny any issues with hair changes or heat or cold problems or diarrhea or constipation. They deny any chest pain or palpitations. They are currently on levothyroxine 75 micrograms   Hyperlipidemia Patient is coming in for recheck of his hyperlipidemia. The patient is currently taking Lipitor. They deny any issues with myalgias or history of liver damage from it. They deny any focal numbness or weakness or chest pain.   Depression and anxiety recheck Patient is currently on Prozac for depression and anxiety and feels like things are doing well.  She has been on it many years and she still feels like is doing very well for her. Depression screen [facility] 2/9 02/19/2021 08/21/2020 08/17/2020 01/24/2020 09/26/2019  Decreased Interest 0 0 0 0 0  Down, Depressed, Hopeless 0 0 0 0 0  PHQ - 2 Score 0 0 0 0 0     GERD recheck Patient is having some issues with GERD.  She continues to take Nexium as a follow-up with her gastroenterologist for possible EGD here soon.  It does help her still but she does get some breakthrough symptoms.  Relevant past medical, surgical, family and social history reviewed and updated as indicated. Interim medical history since our last visit reviewed. Allergies and medications reviewed and updated.  Review of Systems  Constitutional:  Negative for chills and fever.  Eyes:  Negative for visual disturbance.  Respiratory:  Negative for chest tightness and shortness of breath.   Cardiovascular:  Negative for chest pain and leg swelling.  Skin:   Negative for rash.  Neurological:  Negative for dizziness, light-headedness and headaches.  Psychiatric/Behavioral:  Negative for agitation and behavioral problems.   All other systems reviewed and are negative.  Per HPI unless specifically indicated above   Allergies as of 02/19/2021       Reactions   Promethazine Anaphylaxis, Swelling   tongue swelling  Tongue swells Tongue swells tongue swelling    Nsaids Other (See Comments)   GI bleeding and Barrett's Esophagus   Phenergan [promethazine Hcl] Other (See Comments)   tongue swelling    Sulfa Antibiotics         Medication List        Accurate as of February 19, 2021  1:38 PM. If you have any questions, ask your nurse or doctor.          aspirin 325 MG tablet Take 325 mg by mouth daily.   atorvastatin 40 MG tablet Commonly known as: LIPITOR Take 1 tablet (40 mg total) by mouth daily at 2 PM.   CALCIUM 600+D PO Take by mouth.   docusate sodium 100 MG capsule Commonly known as: COLACE Take 100 mg by mouth daily.   esomeprazole 20 MG capsule Commonly known as: NEXIUM Take 20 mg by mouth daily at 12 noon.   FLUoxetine 20 MG capsule Commonly known as: PROZAC Take 1 capsule (20 mg total) by mouth daily.   levothyroxine 75  MCG tablet Commonly known as: SYNTHROID Take 1 tablet (75 mcg total) by mouth daily.   MIRALAX PO Take by mouth.   montelukast 10 MG tablet Commonly known as: SINGULAIR Take 1 tablet (10 mg total) by mouth at bedtime.   OVER THE COUNTER MEDICATION Preser Vision   PRESERVISION AREDS PO Take by mouth.   psyllium 58.6 % powder Commonly known as: METAMUCIL Take 1 packet by mouth daily.   SMARTY PANTS KIDS PROBIOTIC PO Take by mouth daily.         Objective:   BP 117/67   Pulse (!) 53   Temp 97.9 F (36.6 C)   Ht 5' 5"  (1.651 m)   Wt 142 lb 9.6 oz (64.7 kg)   SpO2 97%   BMI 23.73 kg/m   Wt Readings from Last 3 Encounters:  02/19/21 142 lb 9.6 oz (64.7 kg)   08/21/20 151 lb (68.5 kg)  08/17/20 148 lb (67.1 kg)    Physical Exam Vitals and nursing note reviewed.  Constitutional:      General: She is not in acute distress.    Appearance: She is well-developed. She is not diaphoretic.  Eyes:     Conjunctiva/sclera: Conjunctivae normal.  Cardiovascular:     Rate and Rhythm: Normal rate and regular rhythm.     Heart sounds: Normal heart sounds. No murmur heard. Pulmonary:     Effort: Pulmonary effort is normal. No respiratory distress.     Breath sounds: Normal breath sounds. No wheezing.  Musculoskeletal:        General: No swelling or tenderness. Normal range of motion.  Skin:    General: Skin is warm and dry.     Findings: No rash.  Neurological:     Mental Status: She is alert and oriented to person, place, and time.     Coordination: Coordination normal.  Psychiatric:        Behavior: Behavior normal.      Assessment & Plan:   Problem List Items Addressed This Visit       Endocrine   Hypothyroidism - Primary   Relevant Orders   CBC with Differential/Platelet   CMP14+EGFR   TSH     Other   Hyperlipemia   Relevant Orders   CBC with Differential/Platelet   CMP14+EGFR   Lipid panel   Depression, recurrent (HCC)   GAD (generalized anxiety disorder)   Relevant Orders   CBC with Differential/Platelet   CMP14+EGFR   Other Visit Diagnoses     Postmenopausal       Relevant Orders   DG [facility] DEXA       We will do bone densities today, will get blood work as well.  No change in medication for now. Follow up plan: Return in about 6 months (around 08/20/2021), or if symptoms worsen or fail to improve, for Physical exam and thyroid and cholesterol.  Counseling provided for all of the vaccine components Orders Placed This Encounter  Procedures   DG WRFM DEXA   CBC with Differential/Platelet   CMP14+EGFR   Lipid panel   TSH    Caryl Pina, MD [facility] 02/19/2021, 1:38 PM

## 2021-02-20 LAB — LIPID PANEL
Chol/HDL Ratio: 2.6 ratio (ref 0.0–4.4)
Cholesterol, Total: 187 mg/dL (ref 100–199)
HDL: 71 mg/dL (ref >39)
LDL Chol Calc (NIH): 105 mg/dL — ABNORMAL HIGH (ref 0–99)
Triglycerides: 56 mg/dL (ref 0–149)
VLDL Cholesterol Cal: 11 mg/dL (ref 5–40)

## 2021-02-20 LAB — CMP14+EGFR
ALT: 18 IU/L (ref 0–32)
AST: 26 IU/L (ref 0–40)
Albumin/Globulin Ratio: 1.9 (ref 1.2–2.2)
Albumin: 4.5 g/dL (ref 3.7–4.7)
Alkaline Phosphatase: 69 IU/L (ref 44–121)
BUN/Creatinine Ratio: 24 (ref 12–28)
BUN: 17 mg/dL (ref 8–27)
Bilirubin Total: 0.5 mg/dL (ref 0.0–1.2)
CO2: 24 mmol/L (ref 20–29)
Calcium: 9.9 mg/dL (ref 8.7–10.3)
Chloride: 106 mmol/L (ref 96–106)
Creatinine, Ser: 0.7 mg/dL (ref 0.57–1.00)
Globulin, Total: 2.4 g/dL (ref 1.5–4.5)
Glucose: 91 mg/dL (ref 70–99)
Potassium: 5.4 mmol/L — ABNORMAL HIGH (ref 3.5–5.2)
Sodium: 143 mmol/L (ref 134–144)
Total Protein: 6.9 g/dL (ref 6.0–8.5)
eGFR: 91 mL/min/{1.73_m2} (ref >59)

## 2021-02-20 LAB — CBC WITH DIFFERENTIAL/PLATELET
Basophils Absolute: 0 10*3/uL (ref 0.0–0.2)
Basos: 1 %
EOS (ABSOLUTE): 0.1 10*3/uL (ref 0.0–0.4)
Eos: 1 %
Hematocrit: 41.1 % (ref 34.0–46.6)
Hemoglobin: 13.8 g/dL (ref 11.1–15.9)
Immature Grans (Abs): 0 10*3/uL (ref 0.0–0.1)
Immature Granulocytes: 0 %
Lymphocytes Absolute: 2.3 10*3/uL (ref 0.7–3.1)
Lymphs: 42 %
MCH: 31.9 pg (ref 26.6–33.0)
MCHC: 33.6 g/dL (ref 31.5–35.7)
MCV: 95 fL (ref 79–97)
Monocytes Absolute: 0.6 10*3/uL (ref 0.1–0.9)
Monocytes: 11 %
Neutrophils Absolute: 2.5 10*3/uL (ref 1.4–7.0)
Neutrophils: 45 %
Platelets: 299 10*3/uL (ref 150–450)
RBC: 4.33 x10E6/uL (ref 3.77–5.28)
RDW: 11.7 % (ref 11.7–15.4)
WBC: 5.6 10*3/uL (ref 3.4–10.8)

## 2021-02-20 LAB — TSH: TSH: 0.96 u[IU]/mL (ref 0.450–4.500)

## 2021-03-31 DIAGNOSIS — K2289 Other specified disease of esophagus: Secondary | ICD-10-CM | POA: Diagnosis not present

## 2021-03-31 DIAGNOSIS — K2270 Barrett's esophagus without dysplasia: Secondary | ICD-10-CM | POA: Diagnosis not present

## 2021-03-31 DIAGNOSIS — K2100 Gastro-esophageal reflux disease with esophagitis, without bleeding: Secondary | ICD-10-CM | POA: Diagnosis not present

## 2021-03-31 DIAGNOSIS — F419 Anxiety disorder, unspecified: Secondary | ICD-10-CM | POA: Diagnosis not present

## 2021-03-31 DIAGNOSIS — E079 Disorder of thyroid, unspecified: Secondary | ICD-10-CM | POA: Diagnosis not present

## 2021-04-19 DIAGNOSIS — Z23 Encounter for immunization: Secondary | ICD-10-CM | POA: Diagnosis not present

## 2021-06-10 DIAGNOSIS — U071 COVID-19: Secondary | ICD-10-CM | POA: Diagnosis not present

## 2021-06-15 DIAGNOSIS — H353131 Nonexudative age-related macular degeneration, bilateral, early dry stage: Secondary | ICD-10-CM | POA: Diagnosis not present

## 2021-06-15 DIAGNOSIS — H524 Presbyopia: Secondary | ICD-10-CM | POA: Diagnosis not present

## 2021-06-15 DIAGNOSIS — H2513 Age-related nuclear cataract, bilateral: Secondary | ICD-10-CM | POA: Diagnosis not present

## 2021-07-11 ENCOUNTER — Other Ambulatory Visit: Payer: Self-pay | Admitting: Family Medicine

## 2021-07-11 DIAGNOSIS — F411 Generalized anxiety disorder: Secondary | ICD-10-CM

## 2021-07-11 DIAGNOSIS — E039 Hypothyroidism, unspecified: Secondary | ICD-10-CM

## 2021-08-18 ENCOUNTER — Ambulatory Visit: Payer: Medicare Other

## 2021-08-19 ENCOUNTER — Ambulatory Visit: Payer: Medicare Other | Admitting: Family Medicine

## 2021-08-30 ENCOUNTER — Ambulatory Visit (INDEPENDENT_AMBULATORY_CARE_PROVIDER_SITE_OTHER): Payer: Medicare Other

## 2021-08-30 VITALS — Wt 142.0 lb

## 2021-08-30 DIAGNOSIS — Z0000 Encounter for general adult medical examination without abnormal findings: Secondary | ICD-10-CM

## 2021-08-30 NOTE — Patient Instructions (Signed)
Ms. Gasaway , ?Thank you for taking time to come for your Medicare Wellness Visit. I appreciate your ongoing commitment to your health goals. Please review the following plan we discussed and let me know if I can assist you in the future.  ? ?Screening recommendations/referrals: ?Colonoscopy: Done 01/18/2017 - Repeat in 5 years *this September ?Mammogram: Done 01/27/2021 - Repeat annually  ?Bone Density: Done 02/19/2021 - Repeat every 2 years ?Recommended yearly ophthalmology/optometry visit for glaucoma screening and checkup ?Recommended yearly dental visit for hygiene and checkup ? ?Vaccinations: ?Influenza vaccine: Done 04/19/2021 - Repeat annually ?Pneumococcal vaccine: CHYIFOY-77 Done 01/24/2020 - Due for Pneumovax-23 ?Tdap vaccine: Done 07/24/2019 - Repeat in 10 years ?Shingles vaccine: Due - Shingrix is 2 doses 2-6 months apart and over 90% effective     ?Covid-19: Done 08/09/2019 & 09/10/2019 - for boosters, contact pharmacy ? ?Advanced directives: in chart ? ?Conditions/risks identified: Aim for 30 minutes of exercise or brisk walking, 6-8 glasses of water, and 5 servings of fruits and vegetables each day.  ? ?Next appointment: Follow up in one year for your annual wellness visit  ? ? ?Preventive Care 75 Years and Older, Female ?Preventive care refers to lifestyle choices and visits with your health care provider that can promote health and wellness. ?What does preventive care include? ?A yearly physical exam. This is also called an annual well check. ?Dental exams once or twice a year. ?Routine eye exams. Ask your health care provider how often you should have your eyes checked. ?Personal lifestyle choices, including: ?Daily care of your teeth and gums. ?Regular physical activity. ?Eating a healthy diet. ?Avoiding tobacco and drug use. ?Limiting alcohol use. ?Practicing safe sex. ?Taking low-dose aspirin every day. ?Taking vitamin and mineral supplements as recommended by your health care provider. ?What happens  during an annual well check? ?The services and screenings done by your health care provider during your annual well check will depend on your age, overall health, lifestyle risk factors, and family history of disease. ?Counseling  ?Your health care provider may ask you questions about your: ?Alcohol use. ?Tobacco use. ?Drug use. ?Emotional well-being. ?Home and relationship well-being. ?Sexual activity. ?Eating habits. ?History of falls. ?Memory and ability to understand (cognition). ?Work and work Statistician. ?Reproductive health. ?Screening  ?You may have the following tests or measurements: ?Height, weight, and BMI. ?Blood pressure. ?Lipid and cholesterol levels. These may be checked every 5 years, or more frequently if you are over 18 years old. ?Skin check. ?Lung cancer screening. You may have this screening every year starting at age 75 if you have a 30-pack-year history of smoking and currently smoke or have quit within the past 15 years. ?Fecal occult blood test (FOBT) of the stool. You may have this test every year starting at age 75. ?Flexible sigmoidoscopy or colonoscopy. You may have a sigmoidoscopy every 5 years or a colonoscopy every 10 years starting at age 75. ?Hepatitis C blood test. ?Hepatitis B blood test. ?Sexually transmitted disease (STD) testing. ?Diabetes screening. This is done by checking your blood sugar (glucose) after you have not eaten for a while (fasting). You may have this done every 1-3 years. ?Bone density scan. This is done to screen for osteoporosis. You may have this done starting at age 75. ?Mammogram. This may be done every 1-2 years. Talk to your health care provider about how often you should have regular mammograms. ?Talk with your health care provider about your test results, treatment options, and if necessary, the need for more  tests. ?Vaccines  ?Your health care provider may recommend certain vaccines, such as: ?Influenza vaccine. This is recommended every  year. ?Tetanus, diphtheria, and acellular pertussis (Tdap, Td) vaccine. You may need a Td booster every 10 years. ?Zoster vaccine. You may need this after age 75. ?Pneumococcal 13-valent conjugate (PCV13) vaccine. One dose is recommended after age 30. ?Pneumococcal polysaccharide (PPSV23) vaccine. One dose is recommended after age 75. ?Talk to your health care provider about which screenings and vaccines you need and how often you need them. ?This information is not intended to replace advice given to you by your health care provider. Make sure you discuss any questions you have with your health care provider. ?Document Released: 05/29/2015 Document Revised: 01/20/2016 Document Reviewed: 03/03/2015 ?Elsevier Interactive Patient Education ? 2017 [place]. ? ?Fall Prevention in the Home ?Falls can cause injuries. They can happen to people of all ages. There are many things you can do to make your home safe and to help prevent falls. ?What can I do on the outside of my home? ?Regularly fix the edges of walkways and driveways and fix any cracks. ?Remove anything that might make you trip as you walk through a door, such as a raised step or threshold. ?Trim any bushes or trees on the path to your home. ?Use bright outdoor lighting. ?Clear any walking paths of anything that might make someone trip, such as rocks or tools. ?Regularly check to see if handrails are loose or broken. Make sure that both sides of any steps have handrails. ?Any raised decks and porches should have guardrails on the edges. ?Have any leaves, snow, or ice cleared regularly. ?Use sand or salt on walking paths during winter. ?Clean up any spills in your garage right away. This includes oil or grease spills. ?What can I do in the bathroom? ?Use night lights. ?Install grab bars by the toilet and in the tub and shower. Do not use towel bars as grab bars. ?Use non-skid mats or decals in the tub or shower. ?If you need to sit down in the shower, use a  plastic, non-slip stool. ?Keep the floor dry. Clean up any water that spills on the floor as soon as it happens. ?Remove soap buildup in the tub or shower regularly. ?Attach bath mats securely with double-sided non-slip rug tape. ?Do not have throw rugs and other things on the floor that can make you trip. ?What can I do in the bedroom? ?Use night lights. ?Make sure that you have a light by your bed that is easy to reach. ?Do not use any sheets or blankets that are too big for your bed. They should not hang down onto the floor. ?Have a firm chair that has side arms. You can use this for support while you get dressed. ?Do not have throw rugs and other things on the floor that can make you trip. ?What can I do in the kitchen? ?Clean up any spills right away. ?Avoid walking on wet floors. ?Keep items that you use a lot in easy-to-reach places. ?If you need to reach something above you, use a strong step stool that has a grab bar. ?Keep electrical cords out of the way. ?Do not use floor polish or wax that makes floors slippery. If you must use wax, use non-skid floor wax. ?Do not have throw rugs and other things on the floor that can make you trip. ?What can I do with my stairs? ?Do not leave any items on the stairs. ?Make sure  that there are handrails on both sides of the stairs and use them. Fix handrails that are broken or loose. Make sure that handrails are as long as the stairways. ?Check any carpeting to make sure that it is firmly attached to the stairs. Fix any carpet that is loose or worn. ?Avoid having throw rugs at the top or bottom of the stairs. If you do have throw rugs, attach them to the floor with carpet tape. ?Make sure that you have a light switch at the top of the stairs and the bottom of the stairs. If you do not have them, ask someone to add them for you. ?What else can I do to help prevent falls? ?Wear shoes that: ?Do not have high heels. ?Have rubber bottoms. ?Are comfortable and fit you  well. ?Are closed at the toe. Do not wear sandals. ?If you use a stepladder: ?Make sure that it is fully opened. Do not climb a closed stepladder. ?Make sure that both sides of the stepladder are locked into place. ?Ask someo

## 2021-08-30 NOTE — Progress Notes (Signed)
? ?Subjective:  ? Teresa Frederick is a 76 y.o. female who presents for Medicare Annual (Subsequent) preventive examination. ? ?Virtual Visit via Telephone Note ? ?I connected with  [name] on 08/30/21 at  2:00 PM EDT by telephone and verified that I am speaking with the correct person using two identifiers. ? ?Location: ?Patient: Home ?Provider: [facility] ?Persons participating in the virtual visit: patient/Nurse Health Advisor ?  ?I discussed the limitations, risks, security and privacy concerns of performing an evaluation and management service by telephone and the availability of in person appointments. The patient expressed understanding and agreed to proceed. ? ?Interactive audio and video telecommunications were attempted between this nurse and patient, however failed, due to patient having technical difficulties OR patient did not have access to video capability.  We continued and completed visit with audio only. ? ?Some vital signs may be absent or patient reported.  ? ?Dawanda Mapel Dionne Ano, LPN  ? ?Review of Systems    ? ?Cardiac Risk Factors include: advanced age (>52mn, >>68women);sedentary lifestyle ? ?   ?Objective:  ?  ?Today's Vitals  ? 08/30/21 1404  ?Weight: 142 lb (64.4 kg)  ? ?Body mass index is 23.63 kg/m?. ? ? ?  08/30/2021  ?  2:19 PM 04/28/2019  ? 10:20 AM 03/22/2019  ? 10:46 AM  ?Advanced Directives  ?Does Patient Have a Medical Advance Directive? Yes Yes Yes  ?Type of [place];Out of facility DNR (pink MOST or yellow form) [place]  ?Does patient want to make changes to medical advance directive?   No - Patient declined  ?Copy of [place]? Yes - validated most recent copy scanned in chart (See row information)  No - copy requested  ? ? ?Current Medications (verified) ?Outpatient Encounter Medications as of 08/30/2021  ?Medication Sig  ? aspirin  325 MG tablet Take 325 mg by mouth daily.  ? atorvastatin (LIPITOR) 40 MG tablet Take 1 tablet (40 mg total) by mouth daily at 2 PM.  ? Calcium Carbonate-Vitamin D (CALCIUM 600+D PO) Take by mouth.  ? docusate sodium (COLACE) 100 MG capsule Take 100 mg by mouth daily.   ? esomeprazole (NEXIUM) 20 MG capsule Take 20 mg by mouth daily at 12 noon.  ? FLUoxetine (PROZAC) 20 MG capsule TAKE 1 CAPSULE DAILY  ? levothyroxine (SYNTHROID) 75 MCG tablet TAKE 1 TABLET DAILY  ? montelukast (SINGULAIR) 10 MG tablet Take 1 tablet (10 mg total) by mouth at bedtime.  ? Multiple Vitamins-Minerals (PRESERVISION AREDS PO) Take by mouth.  ? OVER THE COUNTER MEDICATION Preser Vision  ? Polyethylene Glycol 3350 (MIRALAX PO) Take by mouth.   ? Probiotic Product (SMARTY PANTS KIDS PROBIOTIC PO) Take by mouth daily.  ? psyllium (METAMUCIL) 58.6 % powder Take 1 packet by mouth daily.  ? ?Facility-Administered Encounter Medications as of 08/30/2021  ?Medication  ? ciprofloxacin (CIPRO) tablet 500 mg  ? ? ?Allergies (verified) ?Promethazine, Nsaids, Phenergan [promethazine hcl], and Sulfa antibiotics  ? ?History: ?Past Medical History:  ?Diagnosis Date  ? Allergy   ? Anxiety   ? Arthritis   ? Cataract   ? [place]otting disorder ([place]   ? Factor V Leiden mutation ([place]   ? GERD (gastroesophageal reflux disease)   ? Heart murmur   ? Hyperlipidemia   ? Kidney stone   ? Mitral valve prolapse   ? Osteoporosis   ? Thyroid disease   ? ?  Past Surgical History:  ?Procedure Laterality Date  ? ABDOMINAL HYSTERECTOMY  07/25/2017  ? COSMETIC SURGERY  2018  ? brow/eye lift  ? CYSTOCELE REPAIR    ? ?Family History  ?Problem Relation Age of Onset  ? COPD Mother   ? Diabetes Mother   ? Hearing loss Mother   ? Hypertension Mother   ? Stroke Mother 52  ? Early death Father   ? Heart disease Father   ? Heart attack Father 5  ? Obesity Sister   ? Aneurysm Sister   ? COPD Brother   ? Cancer Grandchild   ?     metastatic cancer from melanoma in her eye  ? ?Social History   ? ?Socioeconomic History  ? Marital status: Widowed  ?  Spouse name: Not on file  ? Number of children: 2  ? Years of education: 76  ? Highest education level: High school graduate  ?Occupational History  ? Occupation: retired  ?  Comment: real estate investment  ?Tobacco Use  ? Smoking status: Never  ? Smokeless tobacco: Never  ?Vaping Use  ? Vaping Use: Never used  ?Substance and Sexual Activity  ? Alcohol use: Yes  ?  Alcohol/week: 5.0 standard drinks  ?  Types: 5 Shots of liquor per week  ? Drug use: Never  ? Sexual activity: Yes  ?  Birth control/protection: Surgical  ?Other Topics Concern  ? Not on file  ?Social History Narrative  ? Living with significant other since 2016  ? ?Social Determinants of Health  ? ?Financial Resource Strain: Low Risk   ? Difficulty of Paying Living Expenses: Not hard at all  ?Food Insecurity: No Food Insecurity  ? Worried About Charity fundraiser in the Last Year: Never true  ? Ran Out of Food in the Last Year: Never true  ?Transportation Needs: No Transportation Needs  ? Lack of Transportation (Medical): No  ? Lack of Transportation (Non-Medical): No  ?Physical Activity: Insufficiently Active  ? Days of Exercise per Week: 7 days  ? Minutes of Exercise per Session: 20 min  ?Stress: No Stress Concern Present  ? Feeling of Stress : Not at all  ?Social Connections: Socially Integrated  ? Frequency of Communication with Friends and Family: Twice a week  ? Frequency of Social Gatherings with Friends and Family: Twice a week  ? Attends Religious Services: 1 to 4 times per year  ? Active Member of Clubs or Organizations: Yes  ? Attends Archivist Meetings: 1 to 4 times per year  ? Marital Status: Living with partner  ? ? ?Tobacco Counseling ?Counseling given: Not Answered ? ? ?Clinical Intake: ? ?Pre-visit preparation completed: Yes ? ?Pain : No/denies pain ? ?  ? ?BMI - recorded: 23.63 ?Nutritional Status: BMI of 19-24  Normal ?Nutritional Risks: None ?Diabetes: No ? ?How  often do you need to have someone help you when you read instructions, pamphlets, or other written materials from your doctor or pharmacy?: 1 - Never ? ?Diabetic? no ? ?Interpreter Needed?: No ? ?Information entered by :: Louella Medaglia, LPN ? ? ?Activities of Daily Living ? ?  08/30/2021  ?  2:16 PM  ?In your present state of health, do you have any difficulty performing the following activities:  ?Hearing? 1  ?Comment wears hearing aids  ?Vision? 0  ?Difficulty concentrating or making decisions? 0  ?Walking or climbing stairs? 0  ?Dressing or bathing? 0  ?Doing errands, shopping? 0  ?Preparing Food and eating ?  N  ?Using the Toilet? N  ?In the past six months, have you accidently leaked urine? Y  ?Comment wears pads for protection - will discuss med with Dr Dettinger  ?Do you have problems with loss of bowel control? N  ?Managing your Medications? N  ?Managing your Finances? N  ?Housekeeping or managing your Housekeeping? N  ? ? ?Patient Care Team: ?Dettinger, Fransisca Kaufmann, MD as PCP - General (Family Medicine) ?Jola Schmidt, MD as Consulting Physician (Ophthalmology) ? ?Indicate any recent Medical Services you may have received from other than [facility] providers in the past year (date may be approximate). ? ?   ?Assessment:  ? This is a routine wellness examination for Nitzia. ? ?Hearing/Vision screen ?Hearing Screening - Comments:: Wears hearing aids she got when she lived in [place] - she sometimes f/u with ENT in [place] ?Vision Screening - Comments:: Wears rx glasses - up to date with routine eye exams with [facility] in [place] ? ?Dietary issues and exercise activities discussed: ?Current Exercise Habits: Home exercise routine, Type of exercise: walking;Other - see comments (gardening, house and yard work), Time (Minutes): 20, Frequency (Times/Week): 7, Weekly Exercise (Minutes/Week): 140, Intensity: Mild, Exercise limited by: None identified ? ? Goals Addressed   ? ?  ?  ?  ?  ? This Visit's Progress  ?  DIET -  INCREASE WATER INTAKE   On track  ?  Try to drink 6-8 glasses of water daily ?  ?  Exercise 150 min/wk Moderate Activity   On track  ? ?  ? ?Depression Screen ? ?  08/30/2021  ?  2:19 PM 02/19/2021  ?  1:06 PM

## 2021-09-24 ENCOUNTER — Ambulatory Visit: Payer: Medicare Other | Admitting: Family Medicine

## 2021-09-28 ENCOUNTER — Other Ambulatory Visit: Payer: Self-pay | Admitting: Family Medicine

## 2021-09-28 DIAGNOSIS — J309 Allergic rhinitis, unspecified: Secondary | ICD-10-CM

## 2021-10-02 ENCOUNTER — Other Ambulatory Visit: Payer: Self-pay | Admitting: Family Medicine

## 2021-10-02 DIAGNOSIS — E782 Mixed hyperlipidemia: Secondary | ICD-10-CM

## 2021-10-06 ENCOUNTER — Other Ambulatory Visit: Payer: Self-pay | Admitting: Family Medicine

## 2021-10-06 DIAGNOSIS — F411 Generalized anxiety disorder: Secondary | ICD-10-CM

## 2021-10-08 ENCOUNTER — Encounter: Payer: Self-pay | Admitting: Family Medicine

## 2021-10-08 ENCOUNTER — Ambulatory Visit (INDEPENDENT_AMBULATORY_CARE_PROVIDER_SITE_OTHER): Payer: Medicare Other | Admitting: Family Medicine

## 2021-10-08 VITALS — BP 118/61 | HR 40 | Temp 97.8°F | Ht 65.0 in | Wt 148.2 lb

## 2021-10-08 DIAGNOSIS — F411 Generalized anxiety disorder: Secondary | ICD-10-CM | POA: Diagnosis not present

## 2021-10-08 DIAGNOSIS — Z23 Encounter for immunization: Secondary | ICD-10-CM

## 2021-10-08 DIAGNOSIS — E782 Mixed hyperlipidemia: Secondary | ICD-10-CM | POA: Diagnosis not present

## 2021-10-08 DIAGNOSIS — N3281 Overactive bladder: Secondary | ICD-10-CM

## 2021-10-08 DIAGNOSIS — E039 Hypothyroidism, unspecified: Secondary | ICD-10-CM | POA: Diagnosis not present

## 2021-10-08 DIAGNOSIS — Z0000 Encounter for general adult medical examination without abnormal findings: Secondary | ICD-10-CM

## 2021-10-08 MED ORDER — ATORVASTATIN CALCIUM 40 MG PO TABS: 40.0000 mg | ORAL_TABLET | Freq: Every day | ORAL | 3 refills | Status: DC

## 2021-10-08 MED ORDER — SOLIFENACIN SUCCINATE 10 MG PO TABS: 10.0000 mg | ORAL_TABLET | Freq: Every day | ORAL | 3 refills | Status: DC

## 2021-10-08 MED ORDER — LEVOTHYROXINE SODIUM 75 MCG PO TABS: 75.0000 ug | ORAL_TABLET | Freq: Every day | ORAL | 3 refills | Status: DC

## 2021-10-08 MED ORDER — FLUOXETINE HCL 20 MG PO CAPS: 20.0000 mg | ORAL_CAPSULE | Freq: Every day | ORAL | 3 refills | Status: DC

## 2021-10-08 NOTE — Addendum Note (Signed)
Addended by: Everlean Cherry on: 10/08/2021 02:02 PM   Modules accepted: Orders

## 2021-10-08 NOTE — Progress Notes (Signed)
BP 118/61   Pulse (!) 40   Temp 97.8 F (36.6 C)   Ht 5' 5"  (1.651 m)   Wt 148 lb 3.2 oz (67.2 kg)   SpO2 97%   BMI 24.66 kg/m    Subjective:   Patient ID: Teresa Frederick, female    DOB: [birth year], 76 y.o.   MRN: [id number]  HPI: Teresa Frederick is a 76 y.o. female presenting on 10/08/2021 for Annual Exam   HPI Adult well exam Patient denies any chest pain, shortness of breath, headaches or vision issues, abdominal complaints, diarrhea, nausea, vomiting, or joint issues.  She does complain of some urinary leakage and issues.  She did try Vesicare in the past and it did help her but it was expensive and she is wondering now if it has become cheaper and wants to try it again.  She says if she coughs or sneezes she will frequently urinate herself.  She also gets some overflow leakage to where she has to wear a pad sometimes.  She does get hot and cold sometimes with her thyroid and she wants to check those levels and see where its at.  She says her anxiety and depression has been doing well on the Prozac and she feels like she is doing pretty good.  Hyperlipidemia Patient is coming in for recheck of his hyperlipidemia. The patient is currently taking atorvastatin. They deny any issues with myalgias or history of liver damage from it. They deny any focal numbness or weakness or chest pain.   Hypothyroidism recheck Patient is coming in for thyroid recheck today as well. They deny any issues with hair changes or heat or cold problems or diarrhea or constipation. They deny any chest pain or palpitations. They are currently on levothyroxine 75 micrograms   Relevant past medical, surgical, family and social history reviewed and updated as indicated. Interim medical history since our last visit reviewed. Allergies and medications reviewed and updated.  Review of Systems  Constitutional:  Negative for chills and fever.  HENT:  Negative for congestion, ear discharge, ear pain and tinnitus.    Eyes:  Negative for pain and visual disturbance.  Respiratory:  Negative for cough, chest tightness, shortness of breath and wheezing.   Cardiovascular:  Negative for chest pain, palpitations and leg swelling.  Gastrointestinal:  Negative for abdominal pain, blood in stool, constipation and diarrhea.  Genitourinary:  Negative for difficulty urinating, dysuria and hematuria.  Musculoskeletal:  Negative for back pain, gait problem and myalgias.  Skin:  Negative for rash.  Neurological:  Negative for dizziness, weakness, light-headedness and headaches.  Psychiatric/Behavioral:  Negative for agitation, behavioral problems and suicidal ideas.   All other systems reviewed and are negative.  Per HPI unless specifically indicated above   Allergies as of 10/08/2021       Reactions   Promethazine Anaphylaxis, Swelling   tongue swelling  Tongue swells Tongue swells tongue swelling    Nsaids Other (See Comments)   GI bleeding and Barrett's Esophagus   Phenergan [promethazine Hcl] Other (See Comments)   tongue swelling    Sulfa Antibiotics         Medication List        Accurate as of Oct 08, 2021  1:59 PM. If you have any questions, ask your nurse or doctor.          STOP taking these medications    PRESERVISION AREDS PO Stopped by: Fransisca Kaufmann Sanaia Jasso, MD   SMARTY PANTS KIDS PROBIOTIC PO Stopped  by: Worthy Rancher, MD       TAKE these medications    aspirin 325 MG tablet Take 325 mg by mouth daily.   atorvastatin 40 MG tablet Commonly known as: LIPITOR Take 1 tablet (40 mg total) by mouth daily. What changed: See the new instructions. Changed by: Fransisca Kaufmann Kadedra Vanaken, MD   CALCIUM 600+D PO Take by mouth.   docusate sodium 100 MG capsule Commonly known as: COLACE Take 100 mg by mouth daily.   esomeprazole 20 MG capsule Commonly known as: NEXIUM Take 20 mg by mouth daily at 12 noon.   FLUoxetine 20 MG capsule Commonly known as: PROZAC Take 1 capsule  (20 mg total) by mouth daily.   levothyroxine 75 MCG tablet Commonly known as: SYNTHROID Take 1 tablet (75 mcg total) by mouth daily.   MIRALAX PO Take by mouth.   montelukast 10 MG tablet Commonly known as: SINGULAIR TAKE 1 TABLET AT BEDTIME   OVER THE COUNTER MEDICATION Preser Vision   psyllium 58.6 % powder Commonly known as: METAMUCIL Take 1 packet by mouth daily.   solifenacin 10 MG tablet Commonly known as: VESICARE Take 1 tablet (10 mg total) by mouth daily. Started by: Fransisca Kaufmann Jordanne Elsbury, MD         Objective:   BP 118/61   Pulse (!) 40   Temp 97.8 F (36.6 C)   Ht 5' 5"  (1.651 m)   Wt 148 lb 3.2 oz (67.2 kg)   SpO2 97%   BMI 24.66 kg/m   Wt Readings from Last 3 Encounters:  10/08/21 148 lb 3.2 oz (67.2 kg)  08/30/21 142 lb (64.4 kg)  02/19/21 142 lb 9.6 oz (64.7 kg)    Physical Exam Vitals and nursing note reviewed.  Constitutional:      General: She is not in acute distress.    Appearance: She is well-developed. She is not diaphoretic.  Eyes:     Conjunctiva/sclera: Conjunctivae normal.     Pupils: Pupils are equal, round, and reactive to light.  Cardiovascular:     Rate and Rhythm: Normal rate and regular rhythm.     Heart sounds: Normal heart sounds. No murmur heard. Pulmonary:     Effort: Pulmonary effort is normal. No respiratory distress.     Breath sounds: Normal breath sounds. No wheezing.  Musculoskeletal:        General: No tenderness. Normal range of motion.  Skin:    General: Skin is warm and dry.     Findings: No rash.  Neurological:     Mental Status: She is alert and oriented to person, place, and time.     Coordination: Coordination normal.  Psychiatric:        Behavior: Behavior normal.      Assessment & Plan:   Problem List Items Addressed This Visit       Endocrine   Hypothyroidism   Relevant Medications   levothyroxine (SYNTHROID) 75 MCG tablet   Other Relevant Orders   CBC with Differential/Platelet    Thyroid Panel With TSH     Genitourinary   OAB (overactive bladder)   Relevant Orders   CBC with Differential/Platelet     Other   Hyperlipemia   Relevant Medications   atorvastatin (LIPITOR) 40 MG tablet   Other Relevant Orders   CBC with Differential/Platelet   Lipid panel   GAD (generalized anxiety disorder)   Relevant Medications   FLUoxetine (PROZAC) 20 MG capsule   Other Relevant Orders  CBC with Differential/Platelet   CMP14+EGFR   Other Visit Diagnoses     Physical exam    -  Primary   Relevant Orders   CBC with Differential/Platelet   CMP14+EGFR   Lipid panel   Thyroid Panel With TSH       Continue current medicine, added Vesicare to help with her bladder and see how it does for her.  She will follow-up in 6 months.  Discussed bone density that was stable at -1.7.  No changes Follow up plan: Return in about 6 months (around 04/10/2022), or if symptoms worsen or fail to improve, for Thyroid and cholesterol follow-up.  Counseling provided for all of the vaccine components Orders Placed This Encounter  Procedures   CBC with Differential/Platelet   CMP14+EGFR   Lipid panel   Thyroid Panel With TSH    Caryl Pina, MD [facility] 10/08/2021, 1:59 PM

## 2021-10-12 ENCOUNTER — Telehealth: Payer: Self-pay | Admitting: Family Medicine

## 2021-10-12 ENCOUNTER — Other Ambulatory Visit: Payer: Medicare Other

## 2021-10-12 DIAGNOSIS — F411 Generalized anxiety disorder: Secondary | ICD-10-CM | POA: Diagnosis not present

## 2021-10-12 DIAGNOSIS — N3281 Overactive bladder: Secondary | ICD-10-CM | POA: Diagnosis not present

## 2021-10-12 DIAGNOSIS — Z0000 Encounter for general adult medical examination without abnormal findings: Secondary | ICD-10-CM

## 2021-10-12 DIAGNOSIS — E782 Mixed hyperlipidemia: Secondary | ICD-10-CM | POA: Diagnosis not present

## 2021-10-12 DIAGNOSIS — E039 Hypothyroidism, unspecified: Secondary | ICD-10-CM

## 2021-10-12 NOTE — Telephone Encounter (Signed)
Pt states that her trouble swallowing has improved but still present. States that she still has nasal drainage as well that is new. She did/does not have tongue or facial swelling, rash. Just wants to make sure she does not need to do anything.  Pt received Prevnar 20 on 5/26

## 2021-10-12 NOTE — Telephone Encounter (Signed)
It is possible it could have been a reaction as she was experiencing some swelling.  Given that it is improving and Prevnar 20 is a one-time vaccine, I do not think she needs to do anything different.  Without further assessment it is unknown if her difficulty swallowing could have been due to GERD, esophageal dysphagia, sore throat, etc.

## 2021-10-12 NOTE — Telephone Encounter (Signed)
Pt has been informed of Britney's recommendations. At this time she does not want to schedule an appt. She would like to give it a little more time to see if it improves on its own.Pt is also considering calling her GI doctor as well.

## 2021-10-12 NOTE — Telephone Encounter (Signed)
pt had pneumonia shot on Friday. Pt says that she could not swallow but it has gotten some better.. Was this a reaction to the shot? Please call back

## 2021-10-13 LAB — CBC WITH DIFFERENTIAL/PLATELET
Basophils Absolute: 0.1 10*3/uL (ref 0.0–0.2)
Basos: 1 %
EOS (ABSOLUTE): 0.1 10*3/uL (ref 0.0–0.4)
Eos: 3 %
Hematocrit: 41.6 % (ref 34.0–46.6)
Hemoglobin: 13.6 g/dL (ref 11.1–15.9)
Immature Grans (Abs): 0 10*3/uL (ref 0.0–0.1)
Immature Granulocytes: 0 %
Lymphocytes Absolute: 1.9 10*3/uL (ref 0.7–3.1)
Lymphs: 38 %
MCH: 31.3 pg (ref 26.6–33.0)
MCHC: 32.7 g/dL (ref 31.5–35.7)
MCV: 96 fL (ref 79–97)
Monocytes Absolute: 0.7 10*3/uL (ref 0.1–0.9)
Monocytes: 13 %
Neutrophils Absolute: 2.2 10*3/uL (ref 1.4–7.0)
Neutrophils: 45 %
Platelets: 318 10*3/uL (ref 150–450)
RBC: 4.35 x10E6/uL (ref 3.77–5.28)
RDW: 12.1 % (ref 11.7–15.4)
WBC: 5 10*3/uL (ref 3.4–10.8)

## 2021-10-13 LAB — CMP14+EGFR
ALT: 19 IU/L (ref 0–32)
AST: 26 IU/L (ref 0–40)
Albumin/Globulin Ratio: 1.9 (ref 1.2–2.2)
Albumin: 4.4 g/dL (ref 3.7–4.7)
Alkaline Phosphatase: 70 IU/L (ref 44–121)
BUN/Creatinine Ratio: 19 (ref 12–28)
BUN: 15 mg/dL (ref 8–27)
Bilirubin Total: 0.6 mg/dL (ref 0.0–1.2)
CO2: 25 mmol/L (ref 20–29)
Calcium: 10.1 mg/dL (ref 8.7–10.3)
Chloride: 103 mmol/L (ref 96–106)
Creatinine, Ser: 0.8 mg/dL (ref 0.57–1.00)
Globulin, Total: 2.3 g/dL (ref 1.5–4.5)
Glucose: 98 mg/dL (ref 70–99)
Potassium: 4.9 mmol/L (ref 3.5–5.2)
Sodium: 142 mmol/L (ref 134–144)
Total Protein: 6.7 g/dL (ref 6.0–8.5)
eGFR: 77 mL/min/{1.73_m2} (ref >59)

## 2021-10-13 LAB — LIPID PANEL
Chol/HDL Ratio: 2.6 ratio (ref 0.0–4.4)
Cholesterol, Total: 190 mg/dL (ref 100–199)
HDL: 72 mg/dL (ref >39)
LDL Chol Calc (NIH): 102 mg/dL — ABNORMAL HIGH (ref 0–99)
Triglycerides: 91 mg/dL (ref 0–149)
VLDL Cholesterol Cal: 16 mg/dL (ref 5–40)

## 2021-10-13 LAB — THYROID PANEL WITH TSH
Free Thyroxine Index: 2.3 (ref 1.2–4.9)
T3 Uptake Ratio: 29 % (ref 24–39)
T4, Total: 8 ug/dL (ref 4.5–12.0)
TSH: 2.98 u[IU]/mL (ref 0.450–4.500)

## 2021-12-16 ENCOUNTER — Ambulatory Visit (INDEPENDENT_AMBULATORY_CARE_PROVIDER_SITE_OTHER): Payer: Medicare Other

## 2021-12-16 DIAGNOSIS — Z23 Encounter for immunization: Secondary | ICD-10-CM

## 2021-12-28 DIAGNOSIS — Z86010 Personal history of colonic polyps: Secondary | ICD-10-CM | POA: Diagnosis not present

## 2021-12-28 DIAGNOSIS — R1310 Dysphagia, unspecified: Secondary | ICD-10-CM | POA: Diagnosis not present

## 2021-12-28 DIAGNOSIS — K219 Gastro-esophageal reflux disease without esophagitis: Secondary | ICD-10-CM | POA: Diagnosis not present

## 2021-12-28 DIAGNOSIS — K2270 Barrett's esophagus without dysplasia: Secondary | ICD-10-CM | POA: Diagnosis not present

## 2021-12-31 DIAGNOSIS — H353131 Nonexudative age-related macular degeneration, bilateral, early dry stage: Secondary | ICD-10-CM | POA: Diagnosis not present

## 2022-01-08 ENCOUNTER — Other Ambulatory Visit: Payer: Self-pay | Admitting: Family Medicine

## 2022-01-08 DIAGNOSIS — F411 Generalized anxiety disorder: Secondary | ICD-10-CM

## 2022-01-24 ENCOUNTER — Other Ambulatory Visit (HOSPITAL_COMMUNITY): Payer: Self-pay | Admitting: Family Medicine

## 2022-01-24 DIAGNOSIS — Z1231 Encounter for screening mammogram for malignant neoplasm of breast: Secondary | ICD-10-CM

## 2022-01-27 DIAGNOSIS — Z23 Encounter for immunization: Secondary | ICD-10-CM | POA: Diagnosis not present

## 2022-01-31 ENCOUNTER — Ambulatory Visit (HOSPITAL_COMMUNITY)
Admission: RE | Admit: 2022-01-31 | Discharge: 2022-01-31 | Disposition: A | Discharge status: Home / Self Care | Payer: Medicare Other | Source: Ambulatory Visit | Attending: Family Medicine | Admitting: Family Medicine

## 2022-01-31 DIAGNOSIS — Z1231 Encounter for screening mammogram for malignant neoplasm of breast: Secondary | ICD-10-CM

## 2022-02-23 ENCOUNTER — Other Ambulatory Visit: Payer: Self-pay | Admitting: Family Medicine

## 2022-02-23 DIAGNOSIS — J309 Allergic rhinitis, unspecified: Secondary | ICD-10-CM

## 2022-02-23 DIAGNOSIS — F411 Generalized anxiety disorder: Secondary | ICD-10-CM

## 2022-02-23 DIAGNOSIS — E782 Mixed hyperlipidemia: Secondary | ICD-10-CM

## 2022-03-31 DIAGNOSIS — F419 Anxiety disorder, unspecified: Secondary | ICD-10-CM | POA: Diagnosis not present

## 2022-03-31 DIAGNOSIS — R1314 Dysphagia, pharyngoesophageal phase: Secondary | ICD-10-CM | POA: Diagnosis not present

## 2022-03-31 DIAGNOSIS — R1319 Other dysphagia: Secondary | ICD-10-CM | POA: Diagnosis not present

## 2022-03-31 DIAGNOSIS — Z86010 Personal history of colonic polyps: Secondary | ICD-10-CM | POA: Diagnosis not present

## 2022-03-31 DIAGNOSIS — D122 Benign neoplasm of ascending colon: Secondary | ICD-10-CM | POA: Diagnosis not present

## 2022-04-13 ENCOUNTER — Ambulatory Visit: Payer: Medicare Other | Admitting: Family Medicine

## 2022-04-22 ENCOUNTER — Ambulatory Visit (INDEPENDENT_AMBULATORY_CARE_PROVIDER_SITE_OTHER): Payer: Medicare Other | Admitting: Family Medicine

## 2022-04-22 ENCOUNTER — Encounter: Payer: Self-pay | Admitting: Family Medicine

## 2022-04-22 VITALS — BP 132/72 | HR 53 | Temp 97.0°F | Ht 65.0 in | Wt 150.0 lb

## 2022-04-22 DIAGNOSIS — F339 Major depressive disorder, recurrent, unspecified: Secondary | ICD-10-CM

## 2022-04-22 DIAGNOSIS — Z23 Encounter for immunization: Secondary | ICD-10-CM

## 2022-04-22 DIAGNOSIS — E039 Hypothyroidism, unspecified: Secondary | ICD-10-CM

## 2022-04-22 DIAGNOSIS — F411 Generalized anxiety disorder: Secondary | ICD-10-CM | POA: Diagnosis not present

## 2022-04-22 DIAGNOSIS — E782 Mixed hyperlipidemia: Secondary | ICD-10-CM | POA: Diagnosis not present

## 2022-04-22 MED ORDER — FLUOXETINE HCL 20 MG PO CAPS: 20.0000 mg | ORAL_CAPSULE | Freq: Every day | ORAL | 3 refills | Status: DC

## 2022-04-22 MED ORDER — ATORVASTATIN CALCIUM 40 MG PO TABS: 40.0000 mg | ORAL_TABLET | Freq: Every day | ORAL | 3 refills | Status: DC

## 2022-04-22 NOTE — Progress Notes (Signed)
BP 132/72   Pulse (!) 53   Temp (!) 97 F (36.1 C)   Ht _0  (1.651 m)   Wt 150 lb (68 kg)   SpO2 95%   BMI 24.96 kg/m    Subjective:   Patient ID: Teresa Frederick, female    DOB: 1945-10-07, 76 y.o.   MRN: [id number]  HPI: Teresa Frederick is a 76 y.o. female presenting on 04/22/2022 for Medical Management of Chronic Issues, Hypothyroidism, and Hyperlipidemia   HPI Hypothyroidism recheck Patient is coming in for thyroid recheck today as well. They deny any issues with hair changes or heat or cold problems or diarrhea or constipation. They deny any chest pain or palpitations. They are currently on levothyroxine 75 micrograms   Hyperlipidemia Patient is coming in for recheck of his hyperlipidemia. The patient is currently taking atorvastatin. They deny any issues with myalgias or history of liver damage from it. They deny any focal numbness or weakness or chest pain.   Anxiety and depression recheck Patient currently takes prozac.  Feels like it is doing well and feels like it is still helping with anxiety and depression.  She denies any suicidal ideations.    04/22/2022   12:57 PM 10/08/2021    1:44 PM 08/30/2021    2:19 PM 02/19/2021    1:06 PM 08/21/2020   12:50 PM  Depression screen PHQ 2/9  Decreased Interest 0 0 0 0 0  Down, Depressed, Hopeless 0 0 0 0 0  PHQ - 2 Score 0 0 0 0 0  Altered sleeping 0      Tired, decreased energy 0      Change in appetite 0      Feeling bad or failure about yourself  0      Trouble concentrating 0      Moving slowly or fidgety/restless 0      Suicidal thoughts 0      PHQ-9 Score 0         Relevant past medical, surgical, family and social history reviewed and updated as indicated. Interim medical history since our last visit reviewed. Allergies and medications reviewed and updated.  Review of Systems  Constitutional:  Negative for chills and fever.  HENT:  Positive for congestion (She is just getting over a cold but mostly getting  over). Negative for ear discharge and ear pain.   Eyes:  Negative for redness and visual disturbance.  Respiratory:  Negative for chest tightness and shortness of breath.   Cardiovascular:  Negative for chest pain and leg swelling.  Genitourinary:  Negative for difficulty urinating and dysuria.  Musculoskeletal:  Negative for back pain and gait problem.  Skin:  Negative for rash.  Neurological:  Negative for light-headedness and headaches.  Psychiatric/Behavioral:  Negative for agitation and behavioral problems.   All other systems reviewed and are negative.   Per HPI unless specifically indicated above   Allergies as of 04/22/2022       Reactions   Promethazine Anaphylaxis, Swelling   tongue swelling  Tongue swells Tongue swells tongue swelling    Vesicare [solifenacin Succinate] Swelling   Throat swelling   Nsaids Other (See Comments)   GI bleeding and Barrett's Esophagus   Phenergan [promethazine Hcl] Other (See Comments)   tongue swelling    Sulfa Antibiotics         Medication List        Accurate as of April 22, 2022  1:41 PM. If you have any questions, ask your  nurse or doctor.          STOP taking these medications    solifenacin 10 MG tablet Commonly known as: VESICARE Stopped by: Fransisca Kaufmann Aqua Denslow, MD       TAKE these medications    aspirin 325 MG tablet Take 325 mg by mouth daily.   atorvastatin 40 MG tablet Commonly known as: LIPITOR Take 1 tablet (40 mg total) by mouth daily. What changed: See the new instructions. Changed by: Fransisca Kaufmann Hala Narula, MD   CALCIUM 600+D PO Take by mouth.   docusate sodium 100 MG capsule Commonly known as: COLACE Take 100 mg by mouth daily.   esomeprazole 20 MG capsule Commonly known as: NEXIUM Take 20 mg by mouth daily at 12 noon.   FLUoxetine 20 MG capsule Commonly known as: PROZAC Take 1 capsule (20 mg total) by mouth daily.   levothyroxine 75 MCG tablet Commonly known as: SYNTHROID Take 1  tablet (75 mcg total) by mouth daily.   MIRALAX PO Take by mouth.   montelukast 10 MG tablet Commonly known as: SINGULAIR TAKE 1 TABLET AT BEDTIME   OVER THE COUNTER MEDICATION Preser Vision   psyllium 58.6 % powder Commonly known as: METAMUCIL Take 1 packet by mouth daily.         Objective:   BP 132/72   Pulse (!) 53   Temp (!) 97 F (36.1 C)   Ht _0  (1.651 m)   Wt 150 lb (68 kg)   SpO2 95%   BMI 24.96 kg/m   Wt Readings from Last 3 Encounters:  04/22/22 150 lb (68 kg)  10/08/21 148 lb 3.2 oz (67.2 kg)  08/30/21 142 lb (64.4 kg)    Physical Exam Vitals and nursing note reviewed.  Constitutional:      General: She is not in acute distress.    Appearance: She is well-developed. She is not diaphoretic.  Eyes:     Conjunctiva/sclera: Conjunctivae normal.  Cardiovascular:     Rate and Rhythm: Normal rate and regular rhythm.     Heart sounds: Normal heart sounds. No murmur heard. Pulmonary:     Effort: Pulmonary effort is normal. No respiratory distress.     Breath sounds: Normal breath sounds. No wheezing.  Musculoskeletal:        General: No swelling or tenderness. Normal range of motion.  Skin:    General: Skin is warm and dry.     Findings: No rash.  Neurological:     Mental Status: She is alert and oriented to person, place, and time.     Coordination: Coordination normal.  Psychiatric:        Behavior: Behavior normal.       Assessment & Plan:   Problem List Items Addressed This Visit       Endocrine   Hypothyroidism - Primary   Relevant Orders   CBC with Differential/Platelet   CMP14+EGFR   Lipid panel   TSH     Other   Hyperlipemia   Relevant Medications   atorvastatin (LIPITOR) 40 MG tablet   Other Relevant Orders   CBC with Differential/Platelet   CMP14+EGFR   Lipid panel   TSH   Depression, recurrent (HCC)   Relevant Medications   FLUoxetine (PROZAC) 20 MG capsule   GAD (generalized anxiety disorder)   Relevant  Medications   FLUoxetine (PROZAC) 20 MG capsule   Other Visit Diagnoses     Need for shingles vaccine       Relevant  Orders   Zoster Recombinant (Shingrix ) (Completed)       Patient seems to be doing well, no change medication. Follow up plan: Return in about 6 months (around 10/22/2022), or if symptoms worsen or fail to improve, for Thyroid and cholesterol recheck.  Counseling provided for all of the vaccine components Orders Placed This Encounter  Procedures   Zoster Recombinant (Shingrix )   CBC with Differential/Platelet   CMP14+EGFR   Lipid panel   TSH    Caryl Pina, MD [facility] 04/22/2022, 1:41 PM

## 2022-04-23 LAB — CBC WITH DIFFERENTIAL/PLATELET
Basophils Absolute: 0 10*3/uL (ref 0.0–0.2)
Basos: 1 %
EOS (ABSOLUTE): 0.1 10*3/uL (ref 0.0–0.4)
Eos: 2 %
Hematocrit: 40.5 % (ref 34.0–46.6)
Hemoglobin: 13.1 g/dL (ref 11.1–15.9)
Immature Grans (Abs): 0 10*3/uL (ref 0.0–0.1)
Immature Granulocytes: 0 %
Lymphocytes Absolute: 2.3 10*3/uL (ref 0.7–3.1)
Lymphs: 40 %
MCH: 31.3 pg (ref 26.6–33.0)
MCHC: 32.3 g/dL (ref 31.5–35.7)
MCV: 97 fL (ref 79–97)
Monocytes Absolute: 0.8 10*3/uL (ref 0.1–0.9)
Monocytes: 14 %
Neutrophils Absolute: 2.5 10*3/uL (ref 1.4–7.0)
Neutrophils: 43 %
Platelets: 329 10*3/uL (ref 150–450)
RBC: 4.18 x10E6/uL (ref 3.77–5.28)
RDW: 12.3 % (ref 11.7–15.4)
WBC: 5.8 10*3/uL (ref 3.4–10.8)

## 2022-04-23 LAB — CMP14+EGFR
ALT: 26 IU/L (ref 0–32)
AST: 30 IU/L (ref 0–40)
Albumin/Globulin Ratio: 1.9 (ref 1.2–2.2)
Albumin: 4.2 g/dL (ref 3.8–4.8)
Alkaline Phosphatase: 69 IU/L (ref 44–121)
BUN/Creatinine Ratio: 22 (ref 12–28)
BUN: 16 mg/dL (ref 8–27)
Bilirubin Total: 0.5 mg/dL (ref 0.0–1.2)
CO2: 24 mmol/L (ref 20–29)
Calcium: 9.4 mg/dL (ref 8.7–10.3)
Chloride: 103 mmol/L (ref 96–106)
Creatinine, Ser: 0.73 mg/dL (ref 0.57–1.00)
Globulin, Total: 2.2 g/dL (ref 1.5–4.5)
Glucose: 88 mg/dL (ref 70–99)
Potassium: 4.3 mmol/L (ref 3.5–5.2)
Sodium: 142 mmol/L (ref 134–144)
Total Protein: 6.4 g/dL (ref 6.0–8.5)
eGFR: 86 mL/min/{1.73_m2} (ref >59)

## 2022-04-23 LAB — LIPID PANEL
Chol/HDL Ratio: 2.9 ratio (ref 0.0–4.4)
Cholesterol, Total: 203 mg/dL — ABNORMAL HIGH (ref 100–199)
HDL: 70 mg/dL (ref >39)
LDL Chol Calc (NIH): 113 mg/dL — ABNORMAL HIGH (ref 0–99)
Triglycerides: 117 mg/dL (ref 0–149)
VLDL Cholesterol Cal: 20 mg/dL (ref 5–40)

## 2022-04-23 LAB — TSH: TSH: 1.89 u[IU]/mL (ref 0.450–4.500)

## 2022-06-27 ENCOUNTER — Telehealth: Payer: Self-pay | Admitting: Family Medicine

## 2022-06-27 DIAGNOSIS — J309 Allergic rhinitis, unspecified: Secondary | ICD-10-CM

## 2022-06-27 DIAGNOSIS — E782 Mixed hyperlipidemia: Secondary | ICD-10-CM

## 2022-06-27 DIAGNOSIS — F411 Generalized anxiety disorder: Secondary | ICD-10-CM

## 2022-06-27 DIAGNOSIS — E039 Hypothyroidism, unspecified: Secondary | ICD-10-CM

## 2022-06-27 MED ORDER — MONTELUKAST SODIUM 10 MG PO TABS: 10.0000 mg | ORAL_TABLET | Freq: Every day | ORAL | 1 refills | Status: DC

## 2022-06-27 MED ORDER — ESOMEPRAZOLE MAGNESIUM 20 MG PO CPDR: 20.0000 mg | DELAYED_RELEASE_CAPSULE | Freq: Every day | ORAL | 2 refills | Status: DC

## 2022-06-27 MED ORDER — LEVOTHYROXINE SODIUM 75 MCG PO TABS: 75.0000 ug | ORAL_TABLET | Freq: Every day | ORAL | 3 refills | Status: DC

## 2022-06-27 MED ORDER — FLUOXETINE HCL 20 MG PO CAPS: 20.0000 mg | ORAL_CAPSULE | Freq: Every day | ORAL | 3 refills | Status: DC

## 2022-06-27 MED ORDER — ATORVASTATIN CALCIUM 40 MG PO TABS: 40.0000 mg | ORAL_TABLET | Freq: Every day | ORAL | 3 refills | Status: DC

## 2022-06-27 NOTE — Telephone Encounter (Signed)
Pt called stating that she got new insurance and needs all of her medicines [facility] because she is almost out of them all. Their fax # is [phone]

## 2022-06-27 NOTE — Telephone Encounter (Signed)
Rx sent to pharmacy for patient.

## 2022-06-28 DIAGNOSIS — H353132 Nonexudative age-related macular degeneration, bilateral, intermediate dry stage: Secondary | ICD-10-CM | POA: Diagnosis not present

## 2022-06-28 DIAGNOSIS — H2513 Age-related nuclear cataract, bilateral: Secondary | ICD-10-CM | POA: Diagnosis not present

## 2022-07-08 ENCOUNTER — Telehealth: Payer: Self-pay | Admitting: Family Medicine

## 2022-07-08 DIAGNOSIS — E782 Mixed hyperlipidemia: Secondary | ICD-10-CM

## 2022-07-08 MED ORDER — ATORVASTATIN CALCIUM 40 MG PO TABS: 40.0000 mg | ORAL_TABLET | Freq: Every day | ORAL | 0 refills | Status: DC

## 2022-07-08 NOTE — Telephone Encounter (Signed)
Patient is calling to see if we can call in a weeks worth of Atorvastatin until she can get it from her mail in pharmacy.  There was a problem and she is unable to receive it right away.  Could we call into [facility] just this one time.

## 2022-07-08 NOTE — Telephone Encounter (Signed)
#  7 Atorvastatin sent to pharmacy. Pt made aware.

## 2022-08-09 ENCOUNTER — Encounter: Payer: Self-pay | Admitting: Family Medicine

## 2022-09-27 ENCOUNTER — Telehealth: Payer: Self-pay | Admitting: Family Medicine

## 2022-09-27 NOTE — Telephone Encounter (Signed)
Contacted Teresa Frederick to schedule their annual wellness visit. Patient declined to schedule AWV at this time.  Per cancellation note on 08/30/2022  Thank you,  Judeth Cornfield,  AMB Clinical Support [facility] AWV Program Direct Dial ??[phone]

## 2022-10-03 DIAGNOSIS — D485 Neoplasm of uncertain behavior of skin: Secondary | ICD-10-CM | POA: Diagnosis not present

## 2022-10-03 DIAGNOSIS — L570 Actinic keratosis: Secondary | ICD-10-CM | POA: Diagnosis not present

## 2022-10-03 DIAGNOSIS — L309 Dermatitis, unspecified: Secondary | ICD-10-CM | POA: Diagnosis not present

## 2022-10-24 ENCOUNTER — Ambulatory Visit: Payer: Medicare Other

## 2022-11-24 ENCOUNTER — Encounter: Payer: Self-pay | Admitting: Family Medicine

## 2022-11-24 ENCOUNTER — Ambulatory Visit (INDEPENDENT_AMBULATORY_CARE_PROVIDER_SITE_OTHER): Payer: Medicare Other | Admitting: Family Medicine

## 2022-11-24 VITALS — BP 129/72 | HR 62 | Ht 65.0 in | Wt 144.0 lb

## 2022-11-24 DIAGNOSIS — J309 Allergic rhinitis, unspecified: Secondary | ICD-10-CM | POA: Diagnosis not present

## 2022-11-24 DIAGNOSIS — K219 Gastro-esophageal reflux disease without esophagitis: Secondary | ICD-10-CM | POA: Diagnosis not present

## 2022-11-24 DIAGNOSIS — E782 Mixed hyperlipidemia: Secondary | ICD-10-CM

## 2022-11-24 DIAGNOSIS — N952 Postmenopausal atrophic vaginitis: Secondary | ICD-10-CM | POA: Diagnosis not present

## 2022-11-24 DIAGNOSIS — E039 Hypothyroidism, unspecified: Secondary | ICD-10-CM | POA: Diagnosis not present

## 2022-11-24 DIAGNOSIS — F411 Generalized anxiety disorder: Secondary | ICD-10-CM | POA: Diagnosis not present

## 2022-11-24 MED ORDER — MONTELUKAST SODIUM 10 MG PO TABS: 10.0000 mg | ORAL_TABLET | Freq: Every day | ORAL | 3 refills | Status: DC

## 2022-11-24 MED ORDER — PREMARIN 0.625 MG/GM VA CREA: TOPICAL_CREAM | VAGINAL | 12 refills | Status: DC

## 2022-11-24 MED ORDER — ATORVASTATIN CALCIUM 40 MG PO TABS: 40.0000 mg | ORAL_TABLET | Freq: Every day | ORAL | 3 refills | Status: DC

## 2022-11-24 MED ORDER — LEVOTHYROXINE SODIUM 75 MCG PO TABS: 75.0000 ug | ORAL_TABLET | Freq: Every day | ORAL | 3 refills | Status: DC

## 2022-11-24 MED ORDER — FLUOXETINE HCL 20 MG PO CAPS: 20.0000 mg | ORAL_CAPSULE | Freq: Every day | ORAL | 3 refills | Status: DC

## 2022-11-24 MED ORDER — ESOMEPRAZOLE MAGNESIUM 20 MG PO CPDR: 20.0000 mg | DELAYED_RELEASE_CAPSULE | Freq: Every day | ORAL | 3 refills | Status: DC

## 2022-11-24 NOTE — Progress Notes (Signed)
BP 129/72   Pulse 62   Ht 5\' 5"  (1.651 m)   Wt 144 lb (65.3 kg)   SpO2 97%   BMI 23.96 kg/m    Subjective:   Patient ID: Teresa Frederick, female    DOB: 1946-04-06, 77 y.o.   MRN: [id number]  HPI: Teresa Frederick is a 77 y.o. female presenting on 11/24/2022 for Medical Management of Chronic Issues, Hyperlipidemia, and Hypothyroidism   HPI Hypothyroidism recheck Patient is coming in for thyroid recheck today as well. They deny any issues with hair changes or heat or cold problems or diarrhea or constipation. They deny any chest pain or palpitations. They are currently on levothyroxine 75 micrograms   Hyperlipidemia Patient is coming in for recheck of his hyperlipidemia. The patient is currently taking atorvastatin. They deny any issues with myalgias or history of liver damage from it. They deny any focal numbness or weakness or chest pain.   GERD Patient is currently on Nexium.  She denies any major symptoms or abdominal pain or belching or burping. She denies any blood in her stool or lightheadedness or dizziness.   Anxiety recheck Patient is coming in for anxiety recheck.  She currently takes fluoxetine.  She feels like she is doing well and the medicine seems to be helping her still.  She denies any major issues.    11/24/2022   11:47 AM 04/22/2022   12:57 PM 10/08/2021    1:44 PM 08/30/2021    2:19 PM 02/19/2021    1:06 PM  Depression screen PHQ 2/9  Decreased Interest 0 0 0 0 0  Down, Depressed, Hopeless 0 0 0 0 0  PHQ - 2 Score 0 0 0 0 0  Altered sleeping 0 0     Tired, decreased energy 0 0     Change in appetite 0 0     Feeling bad or failure about yourself  0 0     Trouble concentrating 0 0     Moving slowly or fidgety/restless 0 0     Suicidal thoughts 0 0     PHQ-9 Score 0 0     Difficult doing work/chores Not difficult at all        Patient has been diagnosed with vaginal atrophy before and was prescribed Premarin cream but never took it and is having little  more irritation and a little more urinary issues and she would like to go ahead and try it    Relevant past medical, surgical, family and social history reviewed and updated as indicated. Interim medical history since our last visit reviewed. Allergies and medications reviewed and updated.  Review of Systems  Constitutional:  Negative for chills and fever.  HENT:  Negative for congestion, ear discharge and ear pain.   Eyes:  Negative for redness and visual disturbance.  Respiratory:  Negative for chest tightness and shortness of breath.   Cardiovascular:  Negative for chest pain and leg swelling.  Genitourinary:  Positive for frequency and vaginal pain (Irritation). Negative for difficulty urinating, dysuria, vaginal bleeding and vaginal discharge.  Musculoskeletal:  Negative for back pain and gait problem.  Skin:  Negative for rash.  Neurological:  Negative for light-headedness and headaches.  Psychiatric/Behavioral:  Negative for agitation and behavioral problems.   All other systems reviewed and are negative.   Per HPI unless specifically indicated above   Allergies as of 11/24/2022       Reactions   Promethazine Anaphylaxis, Swelling   tongue swelling  Tongue  swells Tongue swells tongue swelling    Vesicare [solifenacin Succinate] Swelling   Throat swelling   Nsaids Other (See Comments)   GI bleeding and Barrett's Esophagus   Phenergan [promethazine Hcl] Other (See Comments)   tongue swelling    Sulfa Antibiotics         Medication List        Accurate as of November 24, 2022 12:10 PM. If you have any questions, ask your nurse or doctor.          aspirin 325 MG tablet Take 325 mg by mouth daily.   atorvastatin 40 MG tablet Commonly known as: LIPITOR Take 1 tablet (40 mg total) by mouth daily.   CALCIUM 600+D PO Take by mouth.   docusate sodium 100 MG capsule Commonly known as: COLACE Take 100 mg by mouth daily.   esomeprazole 20 MG capsule Commonly  known as: NEXIUM Take 1 capsule (20 mg total) by mouth daily at 12 noon.   FLUoxetine 20 MG capsule Commonly known as: PROZAC Take 1 capsule (20 mg total) by mouth daily.   levothyroxine 75 MCG tablet Commonly known as: SYNTHROID Take 1 tablet (75 mcg total) by mouth daily.   MIRALAX PO Take by mouth.   montelukast 10 MG tablet Commonly known as: SINGULAIR Take 1 tablet (10 mg total) by mouth at bedtime.   OVER THE COUNTER MEDICATION Preser Vision   Premarin vaginal cream Generic drug: conjugated estrogens Start with 1 application daily for 2 weeks and then go to twice weekly and then if doing well can go to once weekly after that. Started by: Elige Radon Conor Lata   psyllium 58.6 % powder Commonly known as: METAMUCIL Take 1 packet by mouth daily.         Objective:   BP 129/72   Pulse 62   Ht 5\' 5"  (1.651 m)   Wt 144 lb (65.3 kg)   SpO2 97%   BMI 23.96 kg/m   Wt Readings from Last 3 Encounters:  11/24/22 144 lb (65.3 kg)  04/22/22 150 lb (68 kg)  10/08/21 148 lb 3.2 oz (67.2 kg)    Physical Exam Vitals and nursing note reviewed.  Constitutional:      General: She is not in acute distress.    Appearance: She is well-developed. She is not diaphoretic.  Eyes:     Conjunctiva/sclera: Conjunctivae normal.  Cardiovascular:     Rate and Rhythm: Normal rate and regular rhythm.     Heart sounds: Normal heart sounds. No murmur heard. Pulmonary:     Effort: Pulmonary effort is normal. No respiratory distress.     Breath sounds: Normal breath sounds. No wheezing.  Musculoskeletal:        General: No swelling. Normal range of motion.  Skin:    General: Skin is warm and dry.     Findings: No rash.  Neurological:     Mental Status: She is alert and oriented to person, place, and time.     Coordination: Coordination normal.  Psychiatric:        Behavior: Behavior normal.       Assessment & Plan:   Problem List Items Addressed This Visit       Digestive    GERD (gastroesophageal reflux disease) - Primary   Relevant Medications   esomeprazole (NEXIUM) 20 MG capsule   Other Relevant Orders   CBC with Differential/Platelet   CMP14+EGFR   Lipid panel     Endocrine   Hypothyroidism  Relevant Medications   levothyroxine (SYNTHROID) 75 MCG tablet   Other Relevant Orders   TSH     Other   Hyperlipemia   Relevant Medications   atorvastatin (LIPITOR) 40 MG tablet   Other Relevant Orders   CBC with Differential/Platelet   CMP14+EGFR   Lipid panel   GAD (generalized anxiety disorder)   Relevant Medications   FLUoxetine (PROZAC) 20 MG capsule   Other Relevant Orders   CBC with Differential/Platelet   CMP14+EGFR   Lipid panel   Other Visit Diagnoses     Chronic allergic rhinitis       Relevant Medications   montelukast (SINGULAIR) 10 MG tablet   Vaginal atrophy       Relevant Medications   conjugated estrogens (PREMARIN) vaginal cream       Will send Premarin cream that she can try for vaginal atrophy, will do blood work today and refill current medicines, no other changes.  Seems to be doing well.  Gave exercises for possible bursitis that she says she has sometimes. Follow up plan: Return in about 6 months (around 05/27/2023), or if symptoms worsen or fail to improve, for Hypothyroidism and hyperlipidemia recheck.  Counseling provided for all of the vaccine components Orders Placed This Encounter  Procedures   CBC with Differential/Platelet   CMP14+EGFR   Lipid panel   TSH    Arville Care, MD [facility] 11/24/2022, 12:10 PM

## 2022-11-25 LAB — CMP14+EGFR
ALT: 16 IU/L (ref 0–32)
AST: 27 IU/L (ref 0–40)
Albumin: 4.3 g/dL (ref 3.8–4.8)
Alkaline Phosphatase: 83 IU/L (ref 44–121)
BUN/Creatinine Ratio: 23 (ref 12–28)
BUN: 16 mg/dL (ref 8–27)
Bilirubin Total: 0.4 mg/dL (ref 0.0–1.2)
CO2: 24 mmol/L (ref 20–29)
Calcium: 9.6 mg/dL (ref 8.7–10.3)
Chloride: 104 mmol/L (ref 96–106)
Creatinine, Ser: 0.7 mg/dL (ref 0.57–1.00)
Globulin, Total: 2.4 g/dL (ref 1.5–4.5)
Glucose: 92 mg/dL (ref 70–99)
Potassium: 4.6 mmol/L (ref 3.5–5.2)
Sodium: 142 mmol/L (ref 134–144)
Total Protein: 6.7 g/dL (ref 6.0–8.5)
eGFR: 90 mL/min/{1.73_m2} (ref >59)

## 2022-11-25 LAB — LIPID PANEL
Chol/HDL Ratio: 2.3 ratio (ref 0.0–4.4)
Cholesterol, Total: 178 mg/dL (ref 100–199)
HDL: 76 mg/dL (ref >39)
LDL Chol Calc (NIH): 91 mg/dL (ref 0–99)
Triglycerides: 56 mg/dL (ref 0–149)
VLDL Cholesterol Cal: 11 mg/dL (ref 5–40)

## 2022-11-25 LAB — CBC WITH DIFFERENTIAL/PLATELET
Basophils Absolute: 0.1 10*3/uL (ref 0.0–0.2)
Basos: 1 %
EOS (ABSOLUTE): 0.1 10*3/uL (ref 0.0–0.4)
Eos: 1 %
Hematocrit: 41.1 % (ref 34.0–46.6)
Hemoglobin: 13.4 g/dL (ref 11.1–15.9)
Immature Grans (Abs): 0 10*3/uL (ref 0.0–0.1)
Immature Granulocytes: 0 %
Lymphocytes Absolute: 2.3 10*3/uL (ref 0.7–3.1)
Lymphs: 33 %
MCH: 31.5 pg (ref 26.6–33.0)
MCHC: 32.6 g/dL (ref 31.5–35.7)
MCV: 97 fL (ref 79–97)
Monocytes Absolute: 0.7 10*3/uL (ref 0.1–0.9)
Monocytes: 10 %
Neutrophils Absolute: 3.8 10*3/uL (ref 1.4–7.0)
Neutrophils: 55 %
Platelets: 320 10*3/uL (ref 150–450)
RBC: 4.25 x10E6/uL (ref 3.77–5.28)
RDW: 12.3 % (ref 11.7–15.4)
WBC: 6.9 10*3/uL (ref 3.4–10.8)

## 2022-11-25 LAB — TSH: TSH: 1.71 u[IU]/mL (ref 0.450–4.500)

## 2023-01-10 ENCOUNTER — Ambulatory Visit (INDEPENDENT_AMBULATORY_CARE_PROVIDER_SITE_OTHER): Payer: Medicare Other | Admitting: Nurse Practitioner

## 2023-01-10 ENCOUNTER — Encounter: Payer: Self-pay | Admitting: Nurse Practitioner

## 2023-01-10 VITALS — BP 100/61 | HR 65 | Temp 98.2°F | Resp 20 | Ht 65.0 in | Wt 148.0 lb

## 2023-01-10 DIAGNOSIS — R300 Dysuria: Secondary | ICD-10-CM

## 2023-01-10 LAB — MICROSCOPIC EXAMINATION
RBC, Urine: NONE SEEN /hpf (ref 0–2)
Renal Epithel, UA: NONE SEEN /hpf
Yeast, UA: NONE SEEN

## 2023-01-10 LAB — URINALYSIS, COMPLETE
Bilirubin, UA: NEGATIVE
Glucose, UA: NEGATIVE
Nitrite, UA: NEGATIVE
Protein,UA: NEGATIVE
RBC, UA: NEGATIVE
Specific Gravity, UA: 1.02 (ref 1.005–1.030)
Urobilinogen, Ur: 0.2 mg/dL (ref 0.2–1.0)
pH, UA: 5.5 (ref 5.0–7.5)

## 2023-01-10 MED ORDER — CIPROFLOXACIN HCL 500 MG PO TABS: 500.0000 mg | ORAL_TABLET | Freq: Two times a day (BID) | ORAL | 0 refills | Status: DC

## 2023-01-10 NOTE — Progress Notes (Signed)
   Subjective:    Patient ID: Teresa Frederick, female    DOB: [birth year], 77 y.o.   MRN: [id number]   Chief Complaint: Dysuria   Dysuria  This is a new problem. The current episode started in the past 7 days. The problem has been waxing and waning. The pain is at a severity of 3/10. The pain is mild. There has been no fever. She is Not sexually active. Associated symptoms include frequency and urgency. Pertinent negatives include no hematuria or hesitancy. She has tried nothing for the symptoms. The treatment provided mild relief.    Patient Active Problem List   Diagnosis Date Noted   OAB (overactive bladder) 10/08/2021   Microscopic hematuria 12/13/2019   Personal history of urinary infection 12/13/2019   Hypothyroidism 01/17/2018   Hyperlipemia 01/17/2018   GERD (gastroesophageal reflux disease) 01/17/2018   Depression, recurrent (HCC) 01/17/2018   GAD (generalized anxiety disorder) 01/17/2018       Review of Systems  Genitourinary:  Positive for dysuria, frequency and urgency. Negative for hematuria and hesitancy.       Objective:   Physical Exam Vitals reviewed.  Constitutional:      Appearance: Normal appearance.  Cardiovascular:     Rate and Rhythm: Normal rate and regular rhythm.     Heart sounds: Normal heart sounds.  Pulmonary:     Effort: Pulmonary effort is normal.     Breath sounds: Normal breath sounds.  Skin:    General: Skin is warm.  Neurological:     General: No focal deficit present.     Mental Status: She is alert and oriented to person, place, and time.  Psychiatric:        Mood and Affect: Mood normal.        Behavior: Behavior normal.     BP 100/61   Pulse 65   Temp 98.2 F (36.8 C) (Temporal)   Resp 20   Ht 5\' 5"  (1.651 m)   Wt 148 lb (67.1 kg)   SpO2 99%   BMI 24.63 kg/m        Assessment & Plan:   Teresa Frederick in today with chief complaint of Dysuria   1. Dysuria Take medication as prescribe Cotton underwear Take  shower not bath Cranberry juice, yogurt Force fluids AZO over the counter X2 days Culture pending RTO prn  - Urinalysis, Complete - Urine Culture  Meds ordered this encounter  Medications   ciprofloxacin (CIPRO) 500 MG tablet    Sig: Take 1 tablet (500 mg total) by mouth 2 (two) times daily.    Dispense:  10 tablet    Refill:  0    Order Specific Question:   Supervising Provider    Answer:   Arville Care A [[id number]]   ( Patient requested cipro)  The above assessment and management plan was discussed with the patient. The patient verbalized understanding of and has agreed to the management plan. Patient is aware to call the clinic if symptoms persist or worsen. Patient is aware when to return to the clinic for a follow-up visit. Patient educated on when it is appropriate to go to the emergency department.   Mary-Margaret Daphine Deutscher, FNP

## 2023-01-10 NOTE — Patient Instructions (Signed)
Dysuria Dysuria is pain or discomfort during urination. The pain or discomfort may be felt in the part of the body that drains urine from the bladder (urethra) or in the surrounding tissue of the genitals. The pain may also be felt in the groin area, lower abdomen, or lower back. You may have to urinate frequently or have the sudden feeling that you have to urinate (urgency). Dysuria can affect anyone, but it is more common in females. Dysuria can be caused by many different things, including: Urinary tract infection. Kidney stones or bladder stones. Certain STIs (sexually transmitted infections), such as chlamydia. Dehydration. Inflammation of the tissues of the vagina. Use of certain medicines. Use of certain soaps or scented products that cause irritation. Follow these instructions at home: Medicines Take over-the-counter and prescription medicines only as told by your health care provider. If you were prescribed an antibiotic medicine, take it as told by your health care provider. Do not stop taking the antibiotic even if you start to feel better. Eating and drinking  Drink enough fluid to keep your urine pale yellow. Avoid caffeinated beverages, tea, and alcohol. These beverages can irritate the bladder and make dysuria worse. In males, alcohol may irritate the prostate. General instructions Watch your condition for any changes. Urinate often. Avoid holding urine for long periods of time. If you are female, you should wipe from front to back after urinating or having a bowel movement. Use each piece of toilet paper only once. Empty your bladder after sex. Keep all follow-up visits. This is important. If you had any tests done to find the cause of dysuria, it is up to you to get your test results. Ask your health care provider, or the department that is doing the test, when your results will be ready. Contact a health care provider if: You have a fever. You develop pain in your back or  sides. You have nausea or vomiting. You have blood in your urine. You are not urinating as often as you usually do. Get help right away if: Your pain is severe and not relieved with medicines. You cannot eat or drink without vomiting. You are confused. You have a rapid heartbeat while resting. You have shaking or chills. You feel extremely weak. Summary Dysuria is pain or discomfort while urinating. Many different conditions can lead to dysuria. If you have dysuria, you may have to urinate frequently or have the sudden feeling that you have to urinate (urgency). Watch your condition for any changes. Keep all follow-up visits. Make sure that you urinate often and drink enough fluid to keep your urine pale yellow. This information is not intended to replace advice given to you by your health care provider. Make sure you discuss any questions you have with your health care provider. Document Revised: 12/13/2019 Document Reviewed: 12/13/2019 Elsevier Patient Education  2024 [employer]  

## 2023-01-11 ENCOUNTER — Telehealth: Payer: Self-pay | Admitting: Family Medicine

## 2023-01-11 MED ORDER — TAMSULOSIN HCL 0.4 MG PO CAPS: 0.4000 mg | ORAL_CAPSULE | Freq: Every day | ORAL | 1 refills | Status: DC

## 2023-01-11 NOTE — Telephone Encounter (Signed)
Pt informed of Dr. Darrol Poke recommendations and understood. [facility] [place].

## 2023-01-11 NOTE — Telephone Encounter (Signed)
Urine culture is not back yet but I would have her continue the Cipro.  If she does think it is a kidney stone which is a possibility then we can send Flomax 0.4 mg daily, give her 20 days worth and drink plenty of fluids.  If anything worsens call us back

## 2023-01-11 NOTE — Telephone Encounter (Signed)
Pain in back and abdomen x1wk but at appt with MMM was not having.  Blood on tissue started today.  H/O kidney stone.  Increased urgency as well.  ? U/S. Ok to order?

## 2023-01-12 LAB — URINE CULTURE

## 2023-02-08 ENCOUNTER — Other Ambulatory Visit (HOSPITAL_COMMUNITY): Payer: Self-pay | Admitting: Family Medicine

## 2023-02-08 DIAGNOSIS — Z1231 Encounter for screening mammogram for malignant neoplasm of breast: Secondary | ICD-10-CM

## 2023-02-15 ENCOUNTER — Ambulatory Visit (HOSPITAL_COMMUNITY)
Admission: RE | Admit: 2023-02-15 | Discharge: 2023-02-15 | Disposition: A | Discharge status: Home / Self Care | Payer: Medicare Other | Source: Ambulatory Visit | Attending: Family Medicine | Admitting: Family Medicine

## 2023-02-15 DIAGNOSIS — Z1231 Encounter for screening mammogram for malignant neoplasm of breast: Secondary | ICD-10-CM | POA: Insufficient documentation

## 2023-02-20 ENCOUNTER — Encounter: Payer: Self-pay | Admitting: Family Medicine

## 2023-02-20 ENCOUNTER — Telehealth (INDEPENDENT_AMBULATORY_CARE_PROVIDER_SITE_OTHER): Payer: Medicare Other | Admitting: Family Medicine

## 2023-02-20 ENCOUNTER — Telehealth: Payer: Self-pay | Admitting: Family Medicine

## 2023-02-20 DIAGNOSIS — U071 COVID-19: Secondary | ICD-10-CM

## 2023-02-20 MED ORDER — NIRMATRELVIR/RITONAVIR (PAXLOVID)TABLET: 3.0000 | ORAL_TABLET | Freq: Two times a day (BID) | ORAL | 0 refills | Status: AC

## 2023-02-20 NOTE — Progress Notes (Signed)
Virtual Visit via MyChart video note  I connected with Teresa Frederick on 02/20/23 at 1141 by video and verified that I am speaking with the correct person using two identifiers. Teresa Frederick is currently located at home and patient are currently with her during visit. The provider, Elige Radon Kendall Arnell, MD is located in their office at time of visit.  Call ended at 1153  I discussed the limitations, risks, security and privacy concerns of performing an evaluation and management service by video and the availability of in person appointments. I also discussed with the patient that there may be a patient responsible charge related to this service. The patient expressed understanding and agreed to proceed.   History and Present Illness: Patient is calling in for mammogram and tech was coughing and she her self started with cough and fever and aches and congestion.  She has low grade fever but no SOB or wheezing. She has a lot of sneezing and runny nose. She took tylenol and it helps.   1. COVID-19 virus infection     Outpatient Encounter Medications as of 02/20/2023  Medication Sig   nirmatrelvir/ritonavir (PAXLOVID) 20 x 150 MG & 10 x 100MG  TABS Take 3 tablets by mouth 2 (two) times daily for 5 days. (Take nirmatrelvir 150 mg two tablets twice daily for 5 days and ritonavir 100 mg one tablet twice daily for 5 days) Patient GFR is 90   aspirin 325 MG tablet Take 325 mg by mouth daily.   atorvastatin (LIPITOR) 40 MG tablet Take 1 tablet (40 mg total) by mouth daily.   Calcium Carbonate-Vitamin D (CALCIUM 600+D PO) Take by mouth.   docusate sodium (COLACE) 100 MG capsule Take 100 mg by mouth daily.    FLUoxetine (PROZAC) 20 MG capsule Take 1 capsule (20 mg total) by mouth daily.   levothyroxine (SYNTHROID) 75 MCG tablet Take 1 tablet (75 mcg total) by mouth daily.   montelukast (SINGULAIR) 10 MG tablet Take 1 tablet (10 mg total) by mouth at bedtime.   OVER THE COUNTER MEDICATION Preser  Vision   Polyethylene Glycol 3350 (MIRALAX PO) Take by mouth.    psyllium (METAMUCIL) 58.6 % powder Take 1 packet by mouth daily.   [DISCONTINUED] ciprofloxacin (CIPRO) 500 MG tablet Take 1 tablet (500 mg total) by mouth 2 (two) times daily.   [DISCONTINUED] tamsulosin (FLOMAX) 0.4 MG CAPS capsule Take 1 capsule (0.4 mg total) by mouth daily.   No facility-administered encounter medications on file as of 02/20/2023.    Review of Systems  Constitutional:  Positive for chills and fever.  HENT:  Positive for congestion, postnasal drip, rhinorrhea and sinus pressure. Negative for ear discharge, ear pain, sneezing and sore throat.   Eyes:  Negative for pain, redness and visual disturbance.  Respiratory:  Positive for cough. Negative for chest tightness, shortness of breath and wheezing.   Cardiovascular:  Negative for chest pain and leg swelling.  Genitourinary:  Negative for difficulty urinating and dysuria.  Musculoskeletal:  Positive for myalgias. Negative for back pain and gait problem.  Skin:  Negative for rash.  Neurological:  Negative for light-headedness and headaches.  Psychiatric/Behavioral:  Negative for agitation and behavioral problems.   All other systems reviewed and are negative.   Observations/Objective: Patient sounds comfortable and in no acute distress  Assessment and Plan: Problem List Items Addressed This Visit   None Visit Diagnoses     COVID-19 virus infection    -  Primary   Relevant Medications  nirmatrelvir/ritonavir (PAXLOVID) 20 x 150 MG & 10 x 100MG  TABS       Sent Paxlovid for the patient and recommended for her to do Flonase and Mucinex and Tylenol and ibuprofen and encouraged hydration.  If she starts feeling worse or having shortness of breath to call us back or go to the emergency department. Follow up plan: Return if symptoms worsen or fail to improve.     I discussed the assessment and treatment plan with the patient. The patient was provided  an opportunity to ask questions and all were answered. The patient agreed with the plan and demonstrated an understanding of the instructions.   The patient was advised to call back or seek an in-person evaluation if the symptoms worsen or if the condition fails to improve as anticipated.  The above assessment and management plan was discussed with the patient. The patient verbalized understanding of and has agreed to the management plan. Patient is aware to call the clinic if symptoms persist or worsen. Patient is aware when to return to the clinic for a follow-up visit. Patient educated on when it is appropriate to go to the emergency department.    I provided 12 minutes of non-face-to-face time during this encounter.    Nils Pyle, MD

## 2023-05-31 ENCOUNTER — Telehealth: Payer: Self-pay | Admitting: Family Medicine

## 2023-05-31 ENCOUNTER — Encounter: Payer: Self-pay | Admitting: Family Medicine

## 2023-05-31 ENCOUNTER — Ambulatory Visit: Payer: Medicare Other | Admitting: Family Medicine

## 2023-05-31 VITALS — BP 126/77 | HR 51 | Ht 65.0 in | Wt 157.0 lb

## 2023-05-31 DIAGNOSIS — E039 Hypothyroidism, unspecified: Secondary | ICD-10-CM | POA: Diagnosis not present

## 2023-05-31 DIAGNOSIS — F339 Major depressive disorder, recurrent, unspecified: Secondary | ICD-10-CM | POA: Diagnosis not present

## 2023-05-31 DIAGNOSIS — E782 Mixed hyperlipidemia: Secondary | ICD-10-CM | POA: Diagnosis not present

## 2023-05-31 DIAGNOSIS — F411 Generalized anxiety disorder: Secondary | ICD-10-CM

## 2023-05-31 DIAGNOSIS — Z780 Asymptomatic menopausal state: Secondary | ICD-10-CM

## 2023-05-31 DIAGNOSIS — Z23 Encounter for immunization: Secondary | ICD-10-CM

## 2023-05-31 LAB — LIPID PANEL

## 2023-05-31 MED ORDER — ATORVASTATIN CALCIUM 40 MG PO TABS: 40.0000 mg | ORAL_TABLET | Freq: Every day | ORAL | 3 refills | Status: DC

## 2023-05-31 NOTE — Progress Notes (Signed)
BP 126/77   Pulse (!) 51   Ht 5\' 5"  (1.651 m)   Wt 71.2 kg   SpO2 99%   BMI 26.13 kg/m    Subjective:   Patient ID: Teresa Frederick, female    DOB: 1946-03-05, 78 y.o.   MRN: [id number]  HPI: Teresa Frederick is a 78 y.o. female presenting on 05/31/2023 for Medical Management of Chronic Issues, Hypothyroidism, Hyperlipidemia, and Insomnia (Taking melatonin with only some help)  Hypothyroidism: Currently taking Levothyroxine 75 mcg. Denies any cold or hot intolerance. Denies hair changes or weight changes. Denies any diarrhea or constipation. Will recheck levels today  Hyperlipidemia: Currently taking Atorvastatin 40mg . Denies any pain or weakness. Denies any issues with myalgia or history liver damage. Denies any numbness or weakness. Denies Chest pain.   Anxiety: Currently taking Fluoxetine 20 mg. She feels she is doing well and jokes "staying out of jail". Denies any issues with medication or other major issues.  She feels like she is doing well and feels like the medicines are doing well for her.  Insomnia: She is experiencing some insomnia. She is taking OTC melatonin 10mg  some nights or chamomile tea to help her sleep. She is able to sleep sometimes after taking this.   Relevant past medical, surgical, family and social history reviewed and updated as indicated. Interim medical history since our last visit reviewed. Allergies and medications reviewed and updated.  Review of Systems  Constitutional:  Negative for fatigue, fever and unexpected weight change.  Eyes:  Negative for visual disturbance.  Respiratory:  Negative for cough and shortness of breath.   Cardiovascular:  Negative for chest pain and leg swelling.  Gastrointestinal:  Negative for constipation and diarrhea.  Endocrine: Negative for cold intolerance and heat intolerance.  Genitourinary:  Negative for difficulty urinating and dysuria.  Musculoskeletal:  Negative for back pain and myalgias.  Skin:  Negative  for rash.  Neurological:  Negative for light-headedness, numbness and headaches.  Psychiatric/Behavioral:  Positive for sleep disturbance. Negative for agitation. The patient is not nervous/anxious.   All other systems reviewed and are negative.   Per HPI unless specifically indicated above   Allergies as of 05/31/2023       Reactions   Promethazine Anaphylaxis, Swelling   tongue swelling  Tongue swells Tongue swells tongue swelling    Vesicare [solifenacin Succinate] Swelling   Throat swelling   Nsaids Other (See Comments)   GI bleeding and Barrett's Esophagus   Phenergan [promethazine Hcl] Other (See Comments)   tongue swelling    Sulfa Antibiotics         Medication List        Accurate as of May 31, 2023 11:15 AM. If you have any questions, ask your nurse or doctor.          aspirin 325 MG tablet Take 325 mg by mouth daily.   atorvastatin 40 MG tablet Commonly known as: LIPITOR Take 1 tablet (40 mg total) by mouth daily.   CALCIUM 600+D PO Take by mouth.   docusate sodium 100 MG capsule Commonly known as: COLACE Take 100 mg by mouth daily.   FLUoxetine 20 MG capsule Commonly known as: PROZAC Take 1 capsule (20 mg total) by mouth daily.   levothyroxine 75 MCG tablet Commonly known as: SYNTHROID Take 1 tablet (75 mcg total) by mouth daily.   Melatonin 5 MG Caps Take 5 mg by mouth at bedtime as needed.   MIRALAX PO Take by mouth.   montelukast 10  MG tablet Commonly known as: SINGULAIR Take 1 tablet (10 mg total) by mouth at bedtime.   OVER THE COUNTER MEDICATION Preser Vision   psyllium 58.6 % powder Commonly known as: METAMUCIL Take 1 packet by mouth daily.         Objective:   BP 126/77   Pulse (!) 51   Ht 5\' 5"  (1.651 m)   Wt 71.2 kg   SpO2 99%   BMI 26.13 kg/m   Wt Readings from Last 3 Encounters:  05/31/23 71.2 kg  01/10/23 67.1 kg  11/24/22 65.3 kg    Physical Exam Constitutional:      General: She is not in  acute distress.    Appearance: Normal appearance. She is not diaphoretic.  Eyes:     Conjunctiva/sclera: Conjunctivae normal.  Cardiovascular:     Rate and Rhythm: Normal rate and regular rhythm.     Pulses: Normal pulses.     Heart sounds: Normal heart sounds. No murmur heard. Pulmonary:     Effort: Pulmonary effort is normal. No respiratory distress.     Breath sounds: Normal breath sounds. No wheezing.  Musculoskeletal:        General: No swelling. Normal range of motion.  Skin:    General: Skin is warm and dry.     Capillary Refill: Capillary refill takes less than 2 seconds.  Neurological:     Mental Status: She is alert and oriented to person, place, and time.     Coordination: Coordination normal.     Gait: Gait normal.  Psychiatric:        Mood and Affect: Mood normal.        Behavior: Behavior normal.       Assessment & Plan:   Problem List Items Addressed This Visit       Endocrine   Hypothyroidism   Relevant Orders   CBC with Differential/Platelet (Completed)   CMP14+EGFR (Completed)   Lipid panel (Completed)   TSH (Completed)     Other   Hyperlipemia   Relevant Medications   atorvastatin (LIPITOR) 40 MG tablet   Other Relevant Orders   Lipid panel (Completed)   Depression, recurrent (HCC)   Relevant Orders   CBC with Differential/Platelet (Completed)   GAD (generalized anxiety disorder)   Relevant Orders   CBC with Differential/Platelet (Completed)   Other Visit Diagnoses       Postmenopausal    -  Primary   Relevant Orders   DG [facility] DEXA     Encounter for immunization       Relevant Orders   Flu Vaccine Trivalent High Dose (Fluad) (Completed)       Will recheck CBC with Differentials and CMP labs. Recheck Lipid panel and TSH labs. DEXA scan schedule to assess bone density. Discussed options for insomnia issue. She does not want to take any other medications besides what she is currently using. Follow up plan: Return in about 6 months  (around 11/28/2023), or if symptoms worsen or fail to improve, for Physical exam. Follow up in 6 months to recheck labs for Hypothyroidism and Hyperlipidemia. Contact PCP for further help with insomnia if needed.  Counseling provided for all of the vaccine components No orders of the defined types were placed in this encounter.   Maximino Sarin PA-S 05/31/2023, 11:15 AM  I was personally present for all components of the history, physical exam and/or medical decision making.  I agree with the documentation performed by the student and agree with assessment  and plan above. Arville Care, MD [facility] 06/09/2023, 9:45 AM

## 2023-06-01 LAB — CBC WITH DIFFERENTIAL/PLATELET
Basophils Absolute: 0.1 10*3/uL (ref 0.0–0.2)
Basos: 1 %
EOS (ABSOLUTE): 0.1 10*3/uL (ref 0.0–0.4)
Eos: 2 %
Hematocrit: 44.1 % (ref 34.0–46.6)
Hemoglobin: 14.1 g/dL (ref 11.1–15.9)
Immature Grans (Abs): 0 10*3/uL (ref 0.0–0.1)
Immature Granulocytes: 0 %
Lymphocytes Absolute: 2 10*3/uL (ref 0.7–3.1)
Lymphs: 35 %
MCH: 31.1 pg (ref 26.6–33.0)
MCHC: 32 g/dL (ref 31.5–35.7)
MCV: 97 fL (ref 79–97)
Monocytes Absolute: 0.7 10*3/uL (ref 0.1–0.9)
Monocytes: 13 %
Neutrophils Absolute: 2.8 10*3/uL (ref 1.4–7.0)
Neutrophils: 49 %
Platelets: 337 10*3/uL (ref 150–450)
RBC: 4.54 x10E6/uL (ref 3.77–5.28)
RDW: 12.9 % (ref 11.7–15.4)
WBC: 5.7 10*3/uL (ref 3.4–10.8)

## 2023-06-01 LAB — CMP14+EGFR
ALT: 38 IU/L — ABNORMAL HIGH (ref 0–32)
AST: 48 IU/L — ABNORMAL HIGH (ref 0–40)
Albumin: 4.5 g/dL (ref 3.8–4.8)
Alkaline Phosphatase: 78 [IU]/L (ref 44–121)
BUN/Creatinine Ratio: 23 (ref 12–28)
BUN: 16 mg/dL (ref 8–27)
Bilirubin Total: 0.4 mg/dL (ref 0.0–1.2)
CO2: 25 mmol/L (ref 20–29)
Calcium: 9.5 mg/dL (ref 8.7–10.3)
Chloride: 104 mmol/L (ref 96–106)
Creatinine, Ser: 0.71 mg/dL (ref 0.57–1.00)
Globulin, Total: 2.5 g/dL (ref 1.5–4.5)
Glucose: 101 mg/dL — ABNORMAL HIGH (ref 70–99)
Potassium: 4.3 mmol/L (ref 3.5–5.2)
Sodium: 142 mmol/L (ref 134–144)
Total Protein: 7 g/dL (ref 6.0–8.5)
eGFR: 88 mL/min/{1.73_m2} (ref >59)

## 2023-06-01 LAB — LIPID PANEL
Chol/HDL Ratio: 3.1 ratio (ref 0.0–4.4)
Cholesterol, Total: 258 mg/dL — ABNORMAL HIGH (ref 100–199)
HDL: 84 mg/dL (ref >39)
LDL Chol Calc (NIH): 156 mg/dL — ABNORMAL HIGH (ref 0–99)
Triglycerides: 103 mg/dL (ref 0–149)
VLDL Cholesterol Cal: 18 mg/dL (ref 5–40)

## 2023-06-01 LAB — TSH: TSH: 2.48 u[IU]/mL (ref 0.450–4.500)

## 2023-06-08 ENCOUNTER — Encounter: Payer: Self-pay | Admitting: Family Medicine

## 2023-06-09 ENCOUNTER — Other Ambulatory Visit: Payer: Self-pay | Admitting: Family Medicine

## 2023-06-09 DIAGNOSIS — F411 Generalized anxiety disorder: Secondary | ICD-10-CM

## 2023-07-06 ENCOUNTER — Encounter: Payer: Self-pay | Admitting: Family Medicine

## 2023-07-06 ENCOUNTER — Ambulatory Visit: Payer: Medicare Other | Admitting: Family Medicine

## 2023-07-06 VITALS — BP 131/55 | HR 76 | Temp 96.2°F | Ht 65.0 in | Wt 150.0 lb

## 2023-07-06 DIAGNOSIS — N952 Postmenopausal atrophic vaginitis: Secondary | ICD-10-CM

## 2023-07-06 DIAGNOSIS — R399 Unspecified symptoms and signs involving the genitourinary system: Secondary | ICD-10-CM | POA: Diagnosis not present

## 2023-07-06 LAB — MICROSCOPIC EXAMINATION
Epithelial Cells (non renal): NONE SEEN /[HPF] (ref 0–10)
Renal Epithel, UA: NONE SEEN /[HPF]
WBC, UA: >30 /[HPF] — AB (ref 0–5)
Yeast, UA: NONE SEEN

## 2023-07-06 LAB — URINALYSIS, COMPLETE
Bilirubin, UA: NEGATIVE
Glucose, UA: NEGATIVE
Ketones, UA: NEGATIVE
Nitrite, UA: NEGATIVE
Specific Gravity, UA: 1.02 (ref 1.005–1.030)
Urobilinogen, Ur: 0.2 mg/dL (ref 0.2–1.0)
pH, UA: 5.5 (ref 5.0–7.5)

## 2023-07-06 MED ORDER — ESTRADIOL 0.1 MG/GM VA CREA
1.0000 | TOPICAL_CREAM | Freq: Every day | VAGINAL | 1 refills | Status: DC
Start: 2023-07-06 — End: 2023-08-09

## 2023-07-06 NOTE — Progress Notes (Signed)
BP (!) 131/55   Pulse 76   Temp (!) 96.2 F (35.7 C)   Ht 5\' 5"  (1.651 m)   Wt 150 lb (68 kg)   SpO2 95%   BMI 24.96 kg/m    Subjective:   Patient ID: Teresa Frederick, female    DOB: 12-02-1945, 78 y.o.   MRN: [id number]  HPI: Teresa Frederick is a 78 y.o. female presenting on 07/06/2023 for vaginal irritation (And itching.)   HPI Urinary/vaginal irritation Patient is coming in today with complaints of urinary/vaginal irritation that is been going on for the past couple weeks.  It makes her feel like she has to urinate frequently and she feels like something is far possibly shifted down near her enteritis and she has to lean forward to urinate.  She denies any blood in her urine.  She does have urinary frequency and urgency.  She has a history of hysterectomy with bladder tacking but feels like things may be shifting down there.  She denies any vaginal discharge.  Relevant past medical, surgical, family and social history reviewed and updated as indicated. Interim medical history since our last visit reviewed. Allergies and medications reviewed and updated.  Review of Systems  Constitutional:  Negative for chills and fever.  Eyes:  Negative for visual disturbance.  Respiratory:  Negative for chest tightness and shortness of breath.   Cardiovascular:  Negative for chest pain and leg swelling.  Gastrointestinal:  Negative for abdominal pain.  Genitourinary:  Positive for dysuria, frequency, urgency and vaginal pain. Negative for difficulty urinating, vaginal bleeding and vaginal discharge.  Musculoskeletal:  Negative for back pain and gait problem.  Skin:  Negative for rash.  Neurological:  Negative for light-headedness and headaches.  Psychiatric/Behavioral:  Negative for agitation and behavioral problems.   All other systems reviewed and are negative.   Per HPI unless specifically indicated above   Allergies as of 07/06/2023       Reactions   Promethazine Anaphylaxis,  Swelling   tongue swelling  Tongue swells Tongue swells tongue swelling    Vesicare [solifenacin Succinate] Swelling   Throat swelling   Nsaids Other (See Comments)   GI bleeding and Barrett's Esophagus   Phenergan [promethazine Hcl] Other (See Comments)   tongue swelling    Sulfa Antibiotics         Medication List        Accurate as of July 06, 2023 10:33 AM. If you have any questions, ask your nurse or doctor.          aspirin 325 MG tablet Take 325 mg by mouth daily.   atorvastatin 40 MG tablet Commonly known as: LIPITOR Take 1 tablet (40 mg total) by mouth daily.   CALCIUM 600+D PO Take by mouth.   docusate sodium 100 MG capsule Commonly known as: COLACE Take 100 mg by mouth daily.   estradiol 0.1 MG/GM vaginal cream Commonly known as: ESTRACE Place 1 Applicatorful vaginally at bedtime. Started by: Elige Radon Jacorey Donaway   FLUoxetine 20 MG capsule Commonly known as: PROZAC TAKE 1 CAPSULE DAILY   levothyroxine 75 MCG tablet Commonly known as: SYNTHROID Take 1 tablet (75 mcg total) by mouth daily.   Melatonin 5 MG Caps Take 5 mg by mouth at bedtime as needed.   MIRALAX PO Take by mouth.   montelukast 10 MG tablet Commonly known as: SINGULAIR Take 1 tablet (10 mg total) by mouth at bedtime.   OVER THE COUNTER MEDICATION Preser Vision   psyllium 58.6 %  powder Commonly known as: METAMUCIL Take 1 packet by mouth daily.         Objective:   BP (!) 131/55   Pulse 76   Temp (!) 96.2 F (35.7 C)   Ht 5\' 5"  (1.651 m)   Wt 150 lb (68 kg)   SpO2 95%   BMI 24.96 kg/m   Wt Readings from Last 3 Encounters:  07/06/23 150 lb (68 kg)  05/31/23 157 lb (71.2 kg)  01/10/23 148 lb (67.1 kg)    Physical Exam Vitals and nursing note reviewed. Exam conducted with a chaperone present.  Constitutional:      General: She is not in acute distress.    Appearance: She is well-developed. She is not diaphoretic.  Eyes:     Conjunctiva/sclera:  Conjunctivae normal.  Genitourinary:    Exam position: Lithotomy position.     Labia:        Right: No rash or tenderness.        Left: No rash or tenderness.      Urethra: No prolapse, urethral swelling or urethral lesion.     Vagina: Tenderness (Atrophy) present.     Adnexa:        Right: No mass, tenderness or fullness.         Left: No mass, tenderness or fullness.       Comments: Cervix and uterus absent Skin:    General: Skin is warm and dry.     Findings: No rash.  Neurological:     Mental Status: She is alert and oriented to person, place, and time.     Coordination: Coordination normal.  Psychiatric:        Behavior: Behavior normal.       Assessment & Plan:   Problem List Items Addressed This Visit   None Visit Diagnoses       Vaginal atrophy    -  Primary   Relevant Medications   estradiol (ESTRACE) 0.1 MG/GM vaginal cream     UTI symptoms       Relevant Orders   Urinalysis, Complete   Urine Culture     Will do estrogen cream, gave coupon to get it cheaper, I think that may be the cause of all of her symptoms.  Follow up plan: Return if symptoms worsen or fail to improve.  Counseling provided for all of the vaccine components Orders Placed This Encounter  Procedures   Urine Culture   Urinalysis, Complete    Arville Care, MD [facility] 07/06/2023, 10:33 AM

## 2023-07-07 ENCOUNTER — Telehealth: Payer: Self-pay

## 2023-07-07 ENCOUNTER — Ambulatory Visit: Payer: Self-pay | Admitting: Family Medicine

## 2023-07-07 ENCOUNTER — Encounter: Payer: Self-pay | Admitting: Family Medicine

## 2023-07-07 MED ORDER — CEPHALEXIN 500 MG PO CAPS: 500.0000 mg | ORAL_CAPSULE | Freq: Four times a day (QID) | ORAL | 0 refills | Status: DC

## 2023-07-07 NOTE — Telephone Encounter (Signed)
 Please see result note

## 2023-07-07 NOTE — Addendum Note (Signed)
Addended by: Arville Care on: 07/07/2023 04:22 PM   Modules accepted: Orders

## 2023-07-07 NOTE — Telephone Encounter (Signed)
Copied from CRM [id number]. Topic: Clinical - Lab/Test Results >> Jul 07, 2023 12:20 PM Fredrica W wrote: Reason for CRM: Patient called stated she checked results on MyChart and they show abnormal. Wanted to know if provider called in a new prescription. States she's leaving for out of town tomorrow. Note notes from provider yet on labs. Thank You

## 2023-07-07 NOTE — Telephone Encounter (Signed)
Copied from CRM [id number]. Topic: Clinical - Red Word Triage >> Jul 07, 2023  4:07 PM Antony Haste wrote: Red Word that prompted transfer to Nurse Triage: PT is experiencing burning when urinating.  Chief Complaint:   Pt stated she saw the test results form her urine and wants to know why no one has called her and she is having urinary burning.  Symptoms: burning with urination  Disposition: [] ED /[] Urgent Care (no appt availability in office) / [] Appointment(In office/virtual)/ []  [facility]/ [] Home Care/ [] Refused Recommended Disposition /[] [facility]/ [x]  Follow-up with PCP Additional Notes: called [facility] and spoke with [facility]. Advised pt has results and since no result note nor abx and pt frustrated warm transferred call to office.   Reason for Disposition  [1] Caller requesting NON-URGENT health information AND [2] PCP's office is the best resource  Answer Assessment - Initial Assessment Questions 1. REASON FOR CALL or QUESTION: "What is your reason for calling today?" or "How can I best help you?" or "What question do you have that I can help answer?"     Pt stated she saw the test results form her urine and wants to know why no one has called her and she is having urinary burning.  Protocols used: Information Only Call - No Triage-A-AH

## 2023-07-09 LAB — URINE CULTURE

## 2023-08-09 ENCOUNTER — Encounter: Payer: Self-pay | Admitting: Family Medicine

## 2023-08-09 ENCOUNTER — Ambulatory Visit (INDEPENDENT_AMBULATORY_CARE_PROVIDER_SITE_OTHER): Admitting: Family Medicine

## 2023-08-09 ENCOUNTER — Other Ambulatory Visit (HOSPITAL_COMMUNITY)
Admission: RE | Admit: 2023-08-09 | Discharge: 2023-08-09 | Disposition: A | Discharge status: Home / Self Care | Source: Ambulatory Visit | Attending: Family Medicine | Admitting: Family Medicine

## 2023-08-09 VITALS — BP 120/71 | HR 55 | Temp 97.0°F | Ht 65.0 in | Wt 151.0 lb

## 2023-08-09 DIAGNOSIS — N898 Other specified noninflammatory disorders of vagina: Secondary | ICD-10-CM | POA: Insufficient documentation

## 2023-08-09 DIAGNOSIS — R350 Frequency of micturition: Secondary | ICD-10-CM | POA: Diagnosis not present

## 2023-08-09 LAB — URINALYSIS, COMPLETE
Bilirubin, UA: NEGATIVE
Glucose, UA: NEGATIVE
Ketones, UA: NEGATIVE
Nitrite, UA: NEGATIVE
Protein,UA: NEGATIVE
RBC, UA: NEGATIVE
Specific Gravity, UA: 1.02 (ref 1.005–1.030)
Urobilinogen, Ur: 0.2 mg/dL (ref 0.2–1.0)
pH, UA: 6 (ref 5.0–7.5)

## 2023-08-09 LAB — MICROSCOPIC EXAMINATION
RBC, Urine: NONE SEEN /HPF (ref 0–2)
Renal Epithel, UA: NONE SEEN /HPF
Yeast, UA: NONE SEEN

## 2023-08-09 LAB — CERVICOVAGINAL ANCILLARY ONLY
Bacterial Vaginitis (gardnerella): NEGATIVE
Candida Glabrata: NEGATIVE
Candida Vaginitis: NEGATIVE
Comment: NEGATIVE
Comment: NEGATIVE
Comment: NEGATIVE

## 2023-08-09 NOTE — Progress Notes (Signed)
 BP 120/71   Pulse (!) 55   Temp (!) 97 F (36.1 C)   Ht 5\' 5"  (1.651 m)   Wt 151 lb (68.5 kg)   SpO2 100%   BMI 25.13 kg/m    Subjective:   Patient ID: Teresa Frederick, female    DOB: 1946-02-10, 78 y.o.   MRN: [id number]  HPI: Teresa Frederick is a 78 y.o. female presenting on 08/09/2023 for vaginal irritation   HPI Vaginal irritation and inflammation Patient had tried estrogen cream.  She had persistent vaginal irritation and inflammation.  She did use some cocoa butter externally and it did help some of the but she said the irritation evaluation got worse days ago.  She was treated for UTI with antibiotic and she feels like that got better but it feels more like the vaginal irritation issue.  She did not do the estrogen cream for a week but that did not help the irritation was related to it or something else excellent stopped it.  She has not used it in the past couple weeks.  Relevant past medical, surgical, family and social history reviewed and updated as indicated. Interim medical history since our last visit reviewed. Allergies and medications reviewed and updated.  Review of Systems  Constitutional:  Negative for chills and fever.  HENT:  Negative for congestion, ear discharge and ear pain.   Eyes:  Negative for redness and visual disturbance.  Respiratory:  Negative for chest tightness and shortness of breath.   Cardiovascular:  Negative for chest pain and leg swelling.  Gastrointestinal:  Negative for abdominal pain.  Genitourinary:  Positive for frequency and vaginal pain. Negative for difficulty urinating, dysuria, flank pain, menstrual problem, pelvic pain and vaginal discharge.  Musculoskeletal:  Negative for back pain and gait problem.  Skin:  Negative for rash.  Neurological:  Negative for light-headedness and headaches.  Psychiatric/Behavioral:  Negative for agitation and behavioral problems.   All other systems reviewed and are negative.   Per HPI unless  specifically indicated above   Allergies as of 08/09/2023       Reactions   Promethazine Anaphylaxis, Swelling   tongue swelling  Tongue swells Tongue swells tongue swelling    Vesicare [solifenacin Succinate] Swelling   Throat swelling   Nsaids Other (See Comments)   GI bleeding and Barrett's Esophagus   Phenergan [promethazine Hcl] Other (See Comments)   tongue swelling    Sulfa Antibiotics         Medication List        Accurate as of August 09, 2023  9:59 AM. If you have any questions, ask your nurse or doctor.          STOP taking these medications    cephALEXin 500 MG capsule Commonly known as: KEFLEX Stopped by: Elige Radon Catherina Pates   estradiol 0.1 MG/GM vaginal cream Commonly known as: ESTRACE Stopped by: Elige Radon Rina Adney   Melatonin 5 MG Caps Stopped by: Elige Radon Avabella Wailes       TAKE these medications    aspirin 325 MG tablet Take 325 mg by mouth daily.   atorvastatin 40 MG tablet Commonly known as: LIPITOR Take 1 tablet (40 mg total) by mouth daily.   CALCIUM 600+D PO Take by mouth.   docusate sodium 100 MG capsule Commonly known as: COLACE Take 100 mg by mouth daily.   FLUoxetine 20 MG capsule Commonly known as: PROZAC TAKE 1 CAPSULE DAILY   levothyroxine 75 MCG tablet Commonly known as: SYNTHROID  Take 1 tablet (75 mcg total) by mouth daily.   MIRALAX PO Take by mouth.   montelukast 10 MG tablet Commonly known as: SINGULAIR Take 1 tablet (10 mg total) by mouth at bedtime.   NON FORMULARY Take 1 Dose by mouth at bedtime. Relaxium- sleep aid   OVER THE COUNTER MEDICATION Preser Vision   psyllium 58.6 % powder Commonly known as: METAMUCIL Take 1 packet by mouth daily.         Objective:   BP 120/71   Pulse (!) 55   Temp (!) 97 F (36.1 C)   Ht 5\' 5"  (1.651 m)   Wt 151 lb (68.5 kg)   SpO2 100%   BMI 25.13 kg/m   Wt Readings from Last 3 Encounters:  08/09/23 151 lb (68.5 kg)  07/06/23 150 lb (68 kg)   05/31/23 157 lb (71.2 kg)    Physical Exam Vitals and nursing note reviewed. Exam conducted with a chaperone present.  Constitutional:      General: She is not in acute distress.    Appearance: She is well-developed. She is not diaphoretic.  Eyes:     Conjunctiva/sclera: Conjunctivae normal.  Abdominal:     General: Abdomen is flat. Bowel sounds are normal. There is no distension.     Palpations: Abdomen is soft.     Tenderness: There is no abdominal tenderness. There is no guarding or rebound.     Hernia: There is no hernia in the left inguinal area or right inguinal area.  Genitourinary:    Exam position: Lithotomy position.     Labia:        Right: Rash present.        Left: Rash (Some dry skin) present.      Vagina: No vaginal discharge, erythema, tenderness or bleeding.     Cervix: No cervical motion tenderness, discharge or erythema.     Adnexa: Right adnexa normal and left adnexa normal.       Right: No mass or tenderness.         Left: No mass or tenderness.       Comments: Still has vaginal dryness but does look improved from last exam Musculoskeletal:        General: No tenderness. Normal range of motion.  Lymphadenopathy:     Lower Body: No right inguinal adenopathy. No left inguinal adenopathy.  Skin:    General: Skin is warm and dry.     Findings: No rash.  Neurological:     Mental Status: She is alert and oriented to person, place, and time.     Coordination: Coordination normal.  Psychiatric:        Behavior: Behavior normal.       Assessment & Plan:   Problem List Items Addressed This Visit   None Visit Diagnoses       Vaginal irritation    -  Primary   Relevant Orders   Cervicovaginal ancillary only     Frequent urination       Relevant Orders   Urinalysis, Complete   Urine Culture       Go back to trying estrogen cream just once a week.  Also recommended to continue to use the cocoa butter externally in the perineal region Follow up  plan: Return if symptoms worsen or fail to improve.  Counseling provided for all of the vaccine components Orders Placed This Encounter  Procedures   Urine Culture   Urinalysis, Complete    Arville Care,  MD [facility] 08/09/2023, 9:59 AM

## 2023-08-10 ENCOUNTER — Encounter: Payer: Self-pay | Admitting: Family Medicine

## 2023-08-13 LAB — URINE CULTURE

## 2023-08-14 ENCOUNTER — Other Ambulatory Visit: Payer: Self-pay | Admitting: Family Medicine

## 2023-08-14 MED ORDER — CEPHALEXIN 500 MG PO CAPS
500.0000 mg | ORAL_CAPSULE | Freq: Four times a day (QID) | ORAL | 0 refills | Status: DC
Start: 2023-08-14 — End: 2023-09-22

## 2023-09-21 DIAGNOSIS — W5501XA Bitten by cat, initial encounter: Secondary | ICD-10-CM | POA: Diagnosis not present

## 2023-09-21 DIAGNOSIS — S61257A Open bite of left little finger without damage to nail, initial encounter: Secondary | ICD-10-CM | POA: Diagnosis not present

## 2023-09-21 DIAGNOSIS — S61255A Open bite of left ring finger without damage to nail, initial encounter: Secondary | ICD-10-CM | POA: Diagnosis not present

## 2023-09-21 DIAGNOSIS — Z23 Encounter for immunization: Secondary | ICD-10-CM | POA: Diagnosis not present

## 2023-09-22 ENCOUNTER — Other Ambulatory Visit: Payer: Self-pay

## 2023-09-22 ENCOUNTER — Encounter (HOSPITAL_COMMUNITY): Payer: Self-pay | Admitting: *Deleted

## 2023-09-22 ENCOUNTER — Emergency Department (HOSPITAL_COMMUNITY)
Admission: EM | Admit: 2023-09-22 | Discharge: 2023-09-22 | Disposition: A | Discharge status: Home / Self Care | Attending: Emergency Medicine | Admitting: Emergency Medicine

## 2023-09-22 ENCOUNTER — Emergency Department (HOSPITAL_COMMUNITY)

## 2023-09-22 DIAGNOSIS — Z7982 Long term (current) use of aspirin: Secondary | ICD-10-CM | POA: Diagnosis not present

## 2023-09-22 DIAGNOSIS — S61452D Open bite of left hand, subsequent encounter: Secondary | ICD-10-CM | POA: Diagnosis not present

## 2023-09-22 DIAGNOSIS — S61452A Open bite of left hand, initial encounter: Secondary | ICD-10-CM | POA: Insufficient documentation

## 2023-09-22 DIAGNOSIS — W5501XA Bitten by cat, initial encounter: Secondary | ICD-10-CM | POA: Insufficient documentation

## 2023-09-22 DIAGNOSIS — M799 Soft tissue disorder, unspecified: Secondary | ICD-10-CM | POA: Diagnosis not present

## 2023-09-22 DIAGNOSIS — W5501XD Bitten by cat, subsequent encounter: Secondary | ICD-10-CM | POA: Diagnosis not present

## 2023-09-22 LAB — CBC WITH DIFFERENTIAL/PLATELET
Abs Immature Granulocytes: 0.02 10*3/uL (ref 0.00–0.07)
Basophils Absolute: 0 10*3/uL (ref 0.0–0.1)
Basophils Relative: 1 %
Eosinophils Absolute: 0.1 10*3/uL (ref 0.0–0.5)
Eosinophils Relative: 1 %
HCT: 41.9 % (ref 36.0–46.0)
Hemoglobin: 13.6 g/dL (ref 12.0–15.0)
Immature Granulocytes: 0 %
Lymphocytes Relative: 19 %
Lymphs Abs: 1.6 10*3/uL (ref 0.7–4.0)
MCH: 31.9 pg (ref 26.0–34.0)
MCHC: 32.5 g/dL (ref 30.0–36.0)
MCV: 98.4 fL (ref 80.0–100.0)
Monocytes Absolute: 0.9 10*3/uL (ref 0.1–1.0)
Monocytes Relative: 11 %
Neutro Abs: 5.5 10*3/uL (ref 1.7–7.7)
Neutrophils Relative %: 68 %
Platelets: 362 10*3/uL (ref 150–400)
RBC: 4.26 MIL/uL (ref 3.87–5.11)
RDW: 14.1 % (ref 11.5–15.5)
WBC: 8.2 10*3/uL (ref 4.0–10.5)
nRBC: 0 % (ref 0.0–0.2)

## 2023-09-22 LAB — BASIC METABOLIC PANEL WITH GFR
Anion gap: 9 (ref 5–15)
BUN: 20 mg/dL (ref 8–23)
CO2: 23 mmol/L (ref 22–32)
Calcium: 9.4 mg/dL (ref 8.9–10.3)
Chloride: 104 mmol/L (ref 98–111)
Creatinine, Ser: 0.7 mg/dL (ref 0.44–1.00)
GFR, Estimated: >60 mL/min (ref >60)
Glucose, Bld: 142 mg/dL — ABNORMAL HIGH (ref 70–99)
Potassium: 3.5 mmol/L (ref 3.5–5.1)
Sodium: 136 mmol/L (ref 135–145)

## 2023-09-22 MED ORDER — AMOXICILLIN-POT CLAVULANATE 875-125 MG PO TABS
1.0000 | ORAL_TABLET | Freq: Once | ORAL | Status: AC
  Administered 2023-09-22: 1 via ORAL
  Filled 2023-09-22: qty 1

## 2023-09-22 MED ORDER — AMOXICILLIN-POT CLAVULANATE 875-125 MG PO TABS: 1.0000 | ORAL_TABLET | Freq: Two times a day (BID) | ORAL | 0 refills | Status: DC

## 2023-09-22 NOTE — ED Triage Notes (Signed)
 Pt was removing a tick from her cat, the cat bite pt to left hand.  Seen at [facility] UC, area is red, swollen and warm to touch. The bite occurred on Wednesday.  Cat was up to date on shots. Pt was updated on tetanus and started on antibiotics.  Emesis earlier.

## 2023-09-22 NOTE — ED Provider Notes (Signed)
 [place] EMERGENCY DEPARTMENT AT [facility] Provider Note   CSN: [id number] Arrival date & time: 09/22/23  1455     History  Chief Complaint  Patient presents with   Animal Bite    Teresa Frederick is a 78 y.o. female presenting for re-evaluation of cat bite which occurred 2 days ago.  She was trying to remove a tick from her cat (fully vaccinated including rabies) when he became agitated and bit her left hand (puncture wounds on 4th and fifth fingers).  She was seen and [facility] urgent care and was started on doxycycline and flagyl, has had 2 doses of these abx,  vomited after taking this mornings dose but took on an empty stomach,  after eating she was able to tolerate the doxycycline.  She does have some redness and mild swelling which has extended beyond the penmark per image below.  She denies fevers, chills, no other complaints.  Her tetanus was updated at her UC visit.   The history is provided by the patient.       Home Medications Prior to Admission medications   Medication Sig Start Date End Date Taking? Authorizing Provider  amoxicillin -clavulanate (AUGMENTIN ) 875-125 MG tablet Take 1 tablet by mouth every 12 (twelve) hours. 09/22/23  Yes Juanisha Bautch, PA-C  aspirin 325 MG tablet Take 325 mg by mouth daily.    [provider]  atorvastatin  (LIPITOR) 40 MG tablet Take 1 tablet (40 mg total) by mouth daily. 05/31/23   Dettinger, Lucio Sabin, MD  Calcium  Carbonate-Vitamin D (CALCIUM  600+D PO) Take by mouth.    [provider]  docusate sodium (COLACE) 100 MG capsule Take 100 mg by mouth daily.     [provider]  FLUoxetine  (PROZAC ) 20 MG capsule TAKE 1 CAPSULE DAILY 06/09/23   Dettinger, Lucio Sabin, MD  levothyroxine  (SYNTHROID ) 75 MCG tablet Take 1 tablet (75 mcg total) by mouth daily. 11/24/22   Dettinger, Lucio Sabin, MD  montelukast  (SINGULAIR ) 10 MG tablet Take 1 tablet (10 mg total) by mouth at bedtime. 11/24/22   Dettinger, Lucio Sabin, MD  NON  FORMULARY Take 1 Dose by mouth at bedtime. Relaxium- sleep aid    [provider]  OVER THE COUNTER MEDICATION Preser Vision    [provider]  Polyethylene Glycol 3350 (MIRALAX PO) Take by mouth.     [provider]  psyllium (METAMUCIL) 58.6 % powder Take 1 packet by mouth daily.    [provider]      Allergies    Promethazine, Vesicare  [solifenacin  succinate], Phenergan [promethazine hcl], Sulfa  antibiotics, and Nsaids    Review of Systems   Review of Systems  Constitutional:  Negative for chills and fever.  HENT:  Negative for congestion.   Eyes: Negative.   Respiratory:  Negative for chest tightness and shortness of breath.   Cardiovascular:  Negative for chest pain.  Gastrointestinal:  Negative for abdominal pain.  Genitourinary: Negative.   Musculoskeletal:  Negative for arthralgias, joint swelling and neck pain.  Skin:  Positive for color change and wound. Negative for rash.  Neurological:  Negative for dizziness, weakness, light-headedness, numbness and headaches.  Psychiatric/Behavioral: Negative.    All other systems reviewed and are negative.   Physical Exam Updated Vital Signs BP 113/66   Pulse 61   Temp 97.9 F (36.6 C) (Oral)   Resp 18   Ht 5\' 5"  (1.651 m)   Wt 68.5 kg   SpO2 99%   BMI 25.13 kg/m  Physical Exam Constitutional:      General: She is not in acute distress.    Appearance: She is well-developed.  HENT:     Head: Normocephalic.  Cardiovascular:     Rate and Rhythm: Normal rate.  Pulmonary:     Effort: Pulmonary effort is normal.     Breath sounds: No wheezing.  Musculoskeletal:        General: Normal range of motion.     Cervical back: Neck supple.  Skin:    Findings: Erythema present.     Comments: Mild erythema, most prominent at the pinky finger pip.  She can flex and extend her fingers,  expressing tight sensation but no sig worsened pain.  No lymphangitis,  minimal edema.      ED Results /  Procedures / Treatments   Labs (all labs ordered are listed, but only abnormal results are displayed) Labs Reviewed  BASIC METABOLIC PANEL WITH GFR - Abnormal; Notable for the following components:      Result Value   Glucose, Bld 142 (*)    All other components within normal limits  CBC WITH DIFFERENTIAL/PLATELET    EKG None  Radiology DG Hand Complete Left Result Date: 09/22/2023 CLINICAL DATA:  Cat bite with concern for pinky finger proximal interphalangeal joint involvement. EXAM: LEFT HAND - COMPLETE 3+ VIEW COMPARISON:  None Available. FINDINGS: There is no evidence of fracture or dislocation. Degenerative spurring throughout the digits, including the fifth finger. No erosive or bony destructive change. Soft tissue prominence about the proximal interphalangeal joint of the index finger. No radiopaque foreign body or soft tissue gas. IMPRESSION: 1. Soft tissue prominence about the proximal interphalangeal joint of the index finger. No radiopaque foreign body or soft tissue gas. 2. No radiographic evidence of osteomyelitis.  No acute fracture. 3. Degenerative spurring throughout the digits. Electronically Signed   By: Chadwick Colonel M.D.   On: 09/22/2023 18:42    Procedures Procedures    Medications Ordered in ED Medications  amoxicillin -clavulanate (AUGMENTIN ) 875-125 MG per tablet 1 tablet (1 tablet Oral Given 09/22/23 1919)    ED Course/ Medical Decision Making/ A&P                                 Medical Decision Making Pt with cat bite occurring 2 days ago,  has had 2 doses of abx which was started ytd.  Labs and imaging reassuring.  She has not failed outpatient tx but would benefit from switch to augmentin , dc doxy and flagyl.  Given strict return precautions including sig expanding redness outside of the lines once she has had 2 days of augmentin ,  although advised recheck sooner if she does develop sig expansion of sx.  No sx suggesting synovitis.  Imaging negative for  subc air,  fb or suggestion of pip involvement.   Amount and/or Complexity of Data Reviewed Labs: ordered.    Details: Cbc and bmet reviewed.  Radiology: ordered.    Details: Negative, per above,  mild sts.  Risk Prescription drug management.           Final Clinical Impression(s) / ED Diagnoses Final diagnoses:  Cat bite, initial encounter    Rx / DC Orders ED Discharge Orders          Ordered    amoxicillin -clavulanate (AUGMENTIN ) 875-125 MG tablet  Every 12 hours        09/22/23 1902  Katherine Pancake, PA-C 09/22/23 2002    Mordecai Applebaum, MD 09/22/23 2325

## 2023-09-22 NOTE — ED Notes (Signed)
 Pt/family received d/c paperwork at this time. After going over the paperwork any questions, comments, or concerns were answered to the best of this nurse's knowledge. The pt/family verbally acknowledged the teachings/instructions.

## 2023-10-27 ENCOUNTER — Other Ambulatory Visit: Payer: Self-pay | Admitting: Family Medicine

## 2023-10-27 DIAGNOSIS — E039 Hypothyroidism, unspecified: Secondary | ICD-10-CM

## 2023-11-10 DIAGNOSIS — H524 Presbyopia: Secondary | ICD-10-CM | POA: Diagnosis not present

## 2023-11-10 DIAGNOSIS — H353132 Nonexudative age-related macular degeneration, bilateral, intermediate dry stage: Secondary | ICD-10-CM | POA: Diagnosis not present

## 2023-11-29 ENCOUNTER — Encounter: Payer: Self-pay | Admitting: Family Medicine

## 2023-11-29 ENCOUNTER — Ambulatory Visit (INDEPENDENT_AMBULATORY_CARE_PROVIDER_SITE_OTHER): Payer: Medicare Other

## 2023-11-29 ENCOUNTER — Ambulatory Visit: Payer: Medicare Other | Admitting: Family Medicine

## 2023-11-29 VITALS — BP 130/78 | HR 56 | Ht 65.0 in | Wt 152.0 lb

## 2023-11-29 DIAGNOSIS — M2550 Pain in unspecified joint: Secondary | ICD-10-CM | POA: Diagnosis not present

## 2023-11-29 DIAGNOSIS — E039 Hypothyroidism, unspecified: Secondary | ICD-10-CM

## 2023-11-29 DIAGNOSIS — M8589 Other specified disorders of bone density and structure, multiple sites: Secondary | ICD-10-CM | POA: Diagnosis not present

## 2023-11-29 DIAGNOSIS — Z0000 Encounter for general adult medical examination without abnormal findings: Secondary | ICD-10-CM | POA: Diagnosis not present

## 2023-11-29 DIAGNOSIS — F339 Major depressive disorder, recurrent, unspecified: Secondary | ICD-10-CM

## 2023-11-29 DIAGNOSIS — E782 Mixed hyperlipidemia: Secondary | ICD-10-CM

## 2023-11-29 DIAGNOSIS — J309 Allergic rhinitis, unspecified: Secondary | ICD-10-CM

## 2023-11-29 DIAGNOSIS — F411 Generalized anxiety disorder: Secondary | ICD-10-CM

## 2023-11-29 DIAGNOSIS — Z780 Asymptomatic menopausal state: Secondary | ICD-10-CM | POA: Diagnosis not present

## 2023-11-29 LAB — LIPID PANEL

## 2023-11-29 MED ORDER — FLUOXETINE HCL 40 MG PO CAPS: 40.0000 mg | ORAL_CAPSULE | Freq: Every day | ORAL | 1 refills | Status: DC

## 2023-11-29 MED ORDER — MONTELUKAST SODIUM 10 MG PO TABS: 10.0000 mg | ORAL_TABLET | Freq: Every day | ORAL | 3 refills | Status: DC
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
## 2023-11-30 ENCOUNTER — Ambulatory Visit: Payer: Self-pay | Admitting: Family Medicine

## 2023-11-30 ENCOUNTER — Telehealth: Payer: Self-pay | Admitting: *Deleted

## 2023-11-30 DIAGNOSIS — J309 Allergic rhinitis, unspecified: Secondary | ICD-10-CM

## 2023-11-30 DIAGNOSIS — F411 Generalized anxiety disorder: Secondary | ICD-10-CM

## 2023-11-30 LAB — CBC WITH DIFFERENTIAL/PLATELET
Basophils Absolute: 0.1 x10E3/uL (ref 0.0–0.2)
Basos: 1 %
EOS (ABSOLUTE): 0.1 x10E3/uL (ref 0.0–0.4)
Eos: 2 %
Hematocrit: 41.9 % (ref 34.0–46.6)
Hemoglobin: 13.6 g/dL (ref 11.1–15.9)
Immature Grans (Abs): 0 x10E3/uL (ref 0.0–0.1)
Immature Granulocytes: 0 %
Lymphocytes Absolute: 2 x10E3/uL (ref 0.7–3.1)
Lymphs: 34 %
MCH: 32.2 pg (ref 26.6–33.0)
MCHC: 32.5 g/dL (ref 31.5–35.7)
MCV: 99 fL — ABNORMAL HIGH (ref 79–97)
Monocytes Absolute: 0.8 x10E3/uL (ref 0.1–0.9)
Monocytes: 14 %
Neutrophils Absolute: 2.9 x10E3/uL (ref 1.4–7.0)
Neutrophils: 49 %
Platelets: 331 x10E3/uL (ref 150–450)
RBC: 4.23 x10E6/uL (ref 3.77–5.28)
RDW: 12.8 % (ref 11.7–15.4)
WBC: 5.9 x10E3/uL (ref 3.4–10.8)

## 2023-11-30 LAB — TSH: TSH: 1.49 u[IU]/mL (ref 0.450–4.500)

## 2023-11-30 LAB — CMP14+EGFR
ALT: 18 IU/L (ref 0–32)
AST: 25 IU/L (ref 0–40)
Albumin: 4.2 g/dL (ref 3.8–4.8)
Alkaline Phosphatase: 75 IU/L (ref 44–121)
BUN/Creatinine Ratio: 35 — AB (ref 12–28)
BUN: 23 mg/dL (ref 8–27)
Bilirubin Total: 0.3 mg/dL (ref 0.0–1.2)
CO2: 21 mmol/L (ref 20–29)
Calcium: 9.4 mg/dL (ref 8.7–10.3)
Chloride: 106 mmol/L (ref 96–106)
Creatinine, Ser: 0.66 mg/dL (ref 0.57–1.00)
Globulin, Total: 2.1 g/dL (ref 1.5–4.5)
Glucose: 86 mg/dL (ref 70–99)
Potassium: 4.6 mmol/L (ref 3.5–5.2)
Sodium: 143 mmol/L (ref 134–144)
Total Protein: 6.3 g/dL (ref 6.0–8.5)
eGFR: 91 mL/min/1.73 (ref >59)

## 2023-11-30 LAB — LIPID PANEL
Cholesterol, Total: 190 mg/dL (ref 100–199)
HDL: 62 mg/dL (ref >39)
LDL CALC COMMENT:: 3.1 ratio (ref 0.0–4.4)
LDL Chol Calc (NIH): 113 mg/dL — AB (ref 0–99)
Triglycerides: 82 mg/dL (ref 0–149)
VLDL Cholesterol Cal: 15 mg/dL (ref 5–40)

## 2023-11-30 LAB — LYME DISEASE SEROLOGY W/REFLEX

## 2023-11-30 MED ORDER — FLUOXETINE HCL 40 MG PO CAPS: 40.0000 mg | ORAL_CAPSULE | Freq: Every day | ORAL | 1 refills | Status: DC

## 2023-11-30 MED ORDER — MONTELUKAST SODIUM 10 MG PO TABS: 10.0000 mg | ORAL_TABLET | Freq: Every day | ORAL | 3 refills | Status: AC

## 2023-11-30 NOTE — Telephone Encounter (Signed)
 Ax from [facility], they are no longer mail order pharmacy for pt TC to pt medications should be sent to [facility] Fluoxetine  and Montelukast  from yesterday's appt sent to [facility] taken out of pt's chart

## 2024-01-17 DIAGNOSIS — Z23 Encounter for immunization: Secondary | ICD-10-CM | POA: Diagnosis not present

## 2024-02-15 ENCOUNTER — Other Ambulatory Visit (HOSPITAL_COMMUNITY): Payer: Self-pay | Admitting: Family Medicine

## 2024-02-15 DIAGNOSIS — Z1231 Encounter for screening mammogram for malignant neoplasm of breast: Secondary | ICD-10-CM

## 2024-02-22 ENCOUNTER — Encounter (HOSPITAL_COMMUNITY): Payer: Self-pay

## 2024-02-22 ENCOUNTER — Ambulatory Visit (HOSPITAL_COMMUNITY)
Admission: RE | Admit: 2024-02-22 | Discharge: 2024-02-22 | Disposition: A | Discharge status: Home / Self Care | Source: Ambulatory Visit | Attending: Family Medicine | Admitting: Family Medicine

## 2024-02-22 DIAGNOSIS — Z1231 Encounter for screening mammogram for malignant neoplasm of breast: Secondary | ICD-10-CM | POA: Insufficient documentation

## 2024-04-24 ENCOUNTER — Other Ambulatory Visit: Payer: Self-pay | Admitting: Family Medicine

## 2024-04-24 DIAGNOSIS — E039 Hypothyroidism, unspecified: Secondary | ICD-10-CM

## 2024-05-28 ENCOUNTER — Other Ambulatory Visit: Payer: Self-pay | Admitting: Family Medicine

## 2024-05-28 DIAGNOSIS — E782 Mixed hyperlipidemia: Secondary | ICD-10-CM

## 2024-06-03 ENCOUNTER — Encounter: Payer: Self-pay | Admitting: Family Medicine

## 2024-06-03 ENCOUNTER — Ambulatory Visit: Payer: Self-pay | Admitting: Family Medicine

## 2024-06-03 VITALS — BP 129/74 | HR 75 | Ht 65.0 in | Wt 143.0 lb

## 2024-06-03 DIAGNOSIS — F411 Generalized anxiety disorder: Secondary | ICD-10-CM

## 2024-06-03 DIAGNOSIS — E782 Mixed hyperlipidemia: Secondary | ICD-10-CM

## 2024-06-03 DIAGNOSIS — F339 Major depressive disorder, recurrent, unspecified: Secondary | ICD-10-CM

## 2024-06-03 DIAGNOSIS — E039 Hypothyroidism, unspecified: Secondary | ICD-10-CM

## 2024-06-03 NOTE — Progress Notes (Signed)
 "  BP 129/74   Pulse 75   Ht 5' 5 (1.651 m)   Wt 143 lb (64.9 kg)   SpO2 98%   BMI 23.80 kg/m    Subjective:   Patient ID: Teresa Frederick, female    DOB: 1946/01/06, 79 y.o.   MRN: [id number]  HPI: Teresa Frederick is a 79 y.o. female presenting on 06/03/2024 for Medical Management of Chronic Issues and Hypothyroidism   Discussed the use of AI scribe software for clinical note transcription with the patient, who gave verbal consent to proceed.  History of Present Illness   Teresa Frederick is a 79 year old female who presents for a routine follow-up visit.  Hypothyroidism - Continues daily levothyroxine  therapy - Uncertain about medication effectiveness - No specific symptoms or adverse effects reported  Hyperlipidemia and musculoskeletal symptoms - Long-term atorvastatin  therapy - Experiencing intermittent pains, particularly in the hip - Suspects possible association between hip pain and atorvastatin   Depression and emotional well-being - On fluoxetine , previously increased dosage - Improved mood and emotional state since dosage adjustment - Significant emotional burden related to partner's breakdown following bereavement  Type 2 diabetes mellitus - Taking Jardiance - Reports improvement in condition since starting Jardiance          Relevant past medical, surgical, family and social history reviewed and updated as indicated. Interim medical history since our last visit reviewed. Allergies and medications reviewed and updated.  Review of Systems  Constitutional:  Negative for chills and fever.  Eyes:  Negative for visual disturbance.  Respiratory:  Negative for chest tightness and shortness of breath.   Cardiovascular:  Negative for chest pain and leg swelling.  Musculoskeletal:  Negative for back pain and gait problem.  Skin:  Negative for rash.  Neurological:  Negative for dizziness, light-headedness and headaches.  Psychiatric/Behavioral:  Positive for  dysphoric mood. Negative for agitation, behavioral problems, self-injury, sleep disturbance and suicidal ideas. The patient is nervous/anxious.   All other systems reviewed and are negative.   Per HPI unless specifically indicated above   Allergies as of 06/03/2024       Reactions   Promethazine Anaphylaxis, Swelling   Tongue swelling   Vesicare  [solifenacin  Succinate] Swelling   Throat swelling   Phenergan [promethazine Hcl] Other (See Comments)   Tongue swelling    Sulfa  Antibiotics Other (See Comments)   Unknown    Nsaids Other (See Comments)   GI bleeding and Barrett's Esophagus        Medication List        Accurate as of June 03, 2024 10:55 AM. If you have any questions, ask your nurse or doctor.          aspirin 325 MG tablet Take 325 mg by mouth daily.   atorvastatin  40 MG tablet Commonly known as: LIPITOR TAKE 1 TABLET DAILY   CALCIUM  600+D PO Take by mouth.   docusate sodium 100 MG capsule Commonly known as: COLACE Take 100 mg by mouth daily.   estradiol  0.1 MG/GM vaginal cream Commonly known as: ESTRACE  Place 1 Applicatorful vaginally at bedtime as needed.   FLUoxetine  40 MG capsule Commonly known as: PROZAC  Take 1 capsule (40 mg total) by mouth daily.   levothyroxine  75 MCG tablet Commonly known as: SYNTHROID  TAKE 1 TABLET DAILY   MIRALAX PO Take by mouth.   montelukast  10 MG tablet Commonly known as: SINGULAIR  Take 1 tablet (10 mg total) by mouth at bedtime.   NON FORMULARY Take 1 Dose by mouth  at bedtime. Relaxium- sleep aid   OVER THE COUNTER MEDICATION Preser Vision   psyllium 58.6 % powder Commonly known as: METAMUCIL Take 1 packet by mouth daily.         Objective:   BP 129/74   Pulse 75   Ht 5' 5 (1.651 m)   Wt 143 lb (64.9 kg)   SpO2 98%   BMI 23.80 kg/m   Wt Readings from Last 3 Encounters:  06/03/24 143 lb (64.9 kg)  11/29/23 152 lb (68.9 kg)  09/22/23 151 lb 0.2 oz (68.5 kg)    Physical  Exam Physical Exam   NECK: Thyroid  without nodules or enlargement. CHEST: Lungs clear to auscultation bilaterally. CARDIOVASCULAR: Regular rate and rhythm, no murmurs. EXTREMITIES: No edema, good pulses bilaterally.         Assessment & Plan:   Problem List Items Addressed This Visit       Endocrine   Hypothyroidism - Primary   Relevant Orders   CBC with Differential/Platelet   CMP14+EGFR   Lipid panel   TSH     Other   Hyperlipemia   Relevant Orders   CBC with Differential/Platelet   CMP14+EGFR   Lipid panel   TSH   Depression, recurrent   Relevant Orders   CBC with Differential/Platelet   CMP14+EGFR   Lipid panel   TSH   GAD (generalized anxiety disorder)   Relevant Orders   CBC with Differential/Platelet   CMP14+EGFR   Lipid panel   TSH       Acquired hypothyroidism Currently on levothyroxine  without specific symptoms. - Ordered blood work to assess thyroid  function.  Mixed hyperlipidemia On atorvastatin  with occasional muscle pain, possibly medication-related. - Continue atorvastatin . - Monitor for muscle pain and consider trial off medication if symptoms worsen.  Major depressive disorder, recurrent On fluoxetine  with reported symptom improvement despite recent stressors. - Continue fluoxetine .          Follow up plan: Return in about 6 months (around 12/01/2024), or if symptoms worsen or fail to improve, for Hypothyroidism and physical exam.  Counseling provided for all of the vaccine components Orders Placed This Encounter  Procedures   CBC with Differential/Platelet   CMP14+EGFR   Lipid panel   TSH    Fonda Levins, MD [facility] 06/03/2024, 10:55 AM     "

## 2024-06-04 LAB — CBC WITH DIFFERENTIAL/PLATELET
Basophils Absolute: 0 x10E3/uL (ref 0.0–0.2)
Basos: 1 %
EOS (ABSOLUTE): 0.1 x10E3/uL (ref 0.0–0.4)
Eos: 2 %
Hematocrit: 44.1 % (ref 34.0–46.6)
Hemoglobin: 13.9 g/dL (ref 11.1–15.9)
Immature Grans (Abs): 0 x10E3/uL (ref 0.0–0.1)
Immature Granulocytes: 0 %
Lymphocytes Absolute: 1.7 x10E3/uL (ref 0.7–3.1)
Lymphs: 29 %
MCH: 31.8 pg (ref 26.6–33.0)
MCHC: 31.5 g/dL (ref 31.5–35.7)
MCV: 101 fL — ABNORMAL HIGH (ref 79–97)
Monocytes Absolute: 0.7 x10E3/uL (ref 0.1–0.9)
Monocytes: 12 %
Neutrophils Absolute: 3.3 x10E3/uL (ref 1.4–7.0)
Neutrophils: 56 %
Platelets: 353 x10E3/uL (ref 150–450)
RBC: 4.37 x10E6/uL (ref 3.77–5.28)
RDW: 12 % (ref 11.7–15.4)
WBC: 5.9 x10E3/uL (ref 3.4–10.8)

## 2024-06-04 LAB — CMP14+EGFR
ALT: 23 IU/L (ref 0–32)
AST: 31 IU/L (ref 0–40)
Albumin: 4.6 g/dL (ref 3.8–4.8)
Alkaline Phosphatase: 82 IU/L (ref 49–135)
BUN/Creatinine Ratio: 31 — ABNORMAL HIGH (ref 12–28)
BUN: 23 mg/dL (ref 8–27)
Bilirubin Total: 0.4 mg/dL (ref 0.0–1.2)
CO2: 24 mmol/L (ref 20–29)
Calcium: 9.5 mg/dL (ref 8.7–10.3)
Chloride: 103 mmol/L (ref 96–106)
Creatinine, Ser: 0.74 mg/dL (ref 0.57–1.00)
Globulin, Total: 2.4 g/dL (ref 1.5–4.5)
Glucose: 93 mg/dL (ref 70–99)
Potassium: 4.4 mmol/L (ref 3.5–5.2)
Sodium: 141 mmol/L (ref 134–144)
Total Protein: 7 g/dL (ref 6.0–8.5)
eGFR: 83 mL/min/1.73

## 2024-06-04 LAB — LIPID PANEL
Chol/HDL Ratio: 2.6 ratio (ref 0.0–4.4)
Cholesterol, Total: 203 mg/dL — ABNORMAL HIGH (ref 100–199)
HDL: 78 mg/dL
LDL Chol Calc (NIH): 108 mg/dL — ABNORMAL HIGH (ref 0–99)
Triglycerides: 98 mg/dL (ref 0–149)
VLDL Cholesterol Cal: 17 mg/dL (ref 5–40)

## 2024-06-04 LAB — TSH: TSH: 1.51 u[IU]/mL (ref 0.450–4.500)

## 2024-06-10 ENCOUNTER — Other Ambulatory Visit: Payer: Self-pay | Admitting: Family Medicine

## 2024-06-10 DIAGNOSIS — F411 Generalized anxiety disorder: Secondary | ICD-10-CM

## 2024-06-12 ENCOUNTER — Encounter: Payer: Self-pay | Admitting: Family Medicine

## 2024-06-12 ENCOUNTER — Ambulatory Visit: Payer: Self-pay | Admitting: Family Medicine

## 2024-06-18 ENCOUNTER — Telehealth: Payer: Self-pay | Admitting: Family Medicine

## 2024-06-18 ENCOUNTER — Other Ambulatory Visit: Payer: Self-pay | Admitting: *Deleted

## 2024-06-18 DIAGNOSIS — F411 Generalized anxiety disorder: Secondary | ICD-10-CM

## 2024-06-18 MED ORDER — FLUOXETINE HCL 40 MG PO CAPS: 40.0000 mg | ORAL_CAPSULE | Freq: Every day | ORAL | 1 refills | Status: DC

## 2024-06-19 MED ORDER — FLUOXETINE HCL 40 MG PO CAPS: 40.0000 mg | ORAL_CAPSULE | Freq: Every day | ORAL | 0 refills | Status: AC

## 2024-06-19 NOTE — Telephone Encounter (Signed)
 Informed pt that when request came in from mail order last wk they were requesting 20 mg that is why it was denied. RF for 40 mg was sent to mail order yesterday & I sent in 30-d supply to [facility] today

## 2024-12-13 ENCOUNTER — Encounter: Admitting: Family Medicine
